# Patient Record
Sex: Female | Born: 1970 | Race: Black or African American | Hispanic: No | Marital: Single | Smoking: Never smoker
Health system: Southern US, Community
[De-identification: ages and names within clinical notes are randomized; demographics above are authoritative.]

---

## 1997-03-15 ENCOUNTER — Observation Stay (HOSPITAL_COMMUNITY): Admission: AD | Admit: 1997-03-15 | Discharge: 1997-03-15 | Payer: Self-pay | Admitting: *Deleted

## 1997-03-21 ENCOUNTER — Inpatient Hospital Stay (HOSPITAL_COMMUNITY): Admission: AD | Admit: 1997-03-21 | Discharge: 1997-03-22 | Payer: Self-pay | Admitting: Obstetrics & Gynecology

## 1997-03-23 ENCOUNTER — Inpatient Hospital Stay (HOSPITAL_COMMUNITY): Admission: AD | Admit: 1997-03-23 | Discharge: 1997-03-25 | Payer: Self-pay | Admitting: Obstetrics

## 2008-09-08 ENCOUNTER — Emergency Department: Payer: Self-pay | Admitting: Emergency Medicine

## 2009-02-24 ENCOUNTER — Emergency Department: Payer: Self-pay | Admitting: Internal Medicine

## 2019-01-11 ENCOUNTER — Ambulatory Visit (LOCAL_COMMUNITY_HEALTH_CENTER): Payer: Self-pay

## 2019-01-11 ENCOUNTER — Other Ambulatory Visit: Payer: Self-pay

## 2019-01-11 DIAGNOSIS — Z23 Encounter for immunization: Secondary | ICD-10-CM

## 2019-01-11 NOTE — Progress Notes (Signed)
As client has FPW, influenza vaccine from state supply administered. Rich Number, RN

## 2019-06-12 DIAGNOSIS — R946 Abnormal results of thyroid function studies: Secondary | ICD-10-CM | POA: Diagnosis not present

## 2019-06-12 DIAGNOSIS — N921 Excessive and frequent menstruation with irregular cycle: Secondary | ICD-10-CM | POA: Diagnosis not present

## 2019-10-04 ENCOUNTER — Ambulatory Visit: Payer: Self-pay | Admitting: Dermatology

## 2019-10-23 DIAGNOSIS — Z13220 Encounter for screening for lipoid disorders: Secondary | ICD-10-CM | POA: Diagnosis not present

## 2019-10-23 DIAGNOSIS — Z124 Encounter for screening for malignant neoplasm of cervix: Secondary | ICD-10-CM | POA: Diagnosis not present

## 2019-10-23 DIAGNOSIS — Z131 Encounter for screening for diabetes mellitus: Secondary | ICD-10-CM | POA: Diagnosis not present

## 2019-10-23 DIAGNOSIS — Z01419 Encounter for gynecological examination (general) (routine) without abnormal findings: Secondary | ICD-10-CM | POA: Diagnosis not present

## 2019-10-23 DIAGNOSIS — Z136 Encounter for screening for cardiovascular disorders: Secondary | ICD-10-CM | POA: Diagnosis not present

## 2019-10-23 DIAGNOSIS — Z1321 Encounter for screening for nutritional disorder: Secondary | ICD-10-CM | POA: Diagnosis not present

## 2019-10-31 LAB — HM PAP SMEAR

## 2019-11-08 ENCOUNTER — Other Ambulatory Visit: Payer: Self-pay | Admitting: Obstetrics and Gynecology

## 2019-11-08 DIAGNOSIS — Z1231 Encounter for screening mammogram for malignant neoplasm of breast: Secondary | ICD-10-CM

## 2019-11-15 ENCOUNTER — Ambulatory Visit
Admission: RE | Admit: 2019-11-15 | Discharge: 2019-11-15 | Disposition: A | Discharge status: Home / Self Care | Payer: BC Managed Care – PPO | Source: Ambulatory Visit | Attending: Obstetrics and Gynecology | Admitting: Obstetrics and Gynecology

## 2019-11-15 DIAGNOSIS — Z1231 Encounter for screening mammogram for malignant neoplasm of breast: Secondary | ICD-10-CM | POA: Diagnosis not present

## 2019-11-21 ENCOUNTER — Other Ambulatory Visit: Payer: Self-pay | Admitting: Obstetrics and Gynecology

## 2019-11-21 DIAGNOSIS — R928 Other abnormal and inconclusive findings on diagnostic imaging of breast: Secondary | ICD-10-CM

## 2019-11-21 DIAGNOSIS — N6489 Other specified disorders of breast: Secondary | ICD-10-CM

## 2019-11-21 DIAGNOSIS — N6320 Unspecified lump in the left breast, unspecified quadrant: Secondary | ICD-10-CM

## 2019-11-21 DIAGNOSIS — R921 Mammographic calcification found on diagnostic imaging of breast: Secondary | ICD-10-CM

## 2019-12-11 ENCOUNTER — Other Ambulatory Visit: Payer: Self-pay

## 2019-12-11 ENCOUNTER — Ambulatory Visit
Admission: RE | Admit: 2019-12-11 | Discharge: 2019-12-11 | Disposition: A | Discharge status: Home / Self Care | Payer: BC Managed Care – PPO | Source: Ambulatory Visit | Attending: Obstetrics and Gynecology | Admitting: Obstetrics and Gynecology

## 2019-12-11 DIAGNOSIS — N6320 Unspecified lump in the left breast, unspecified quadrant: Secondary | ICD-10-CM

## 2019-12-11 DIAGNOSIS — N6489 Other specified disorders of breast: Secondary | ICD-10-CM | POA: Insufficient documentation

## 2019-12-11 DIAGNOSIS — R928 Other abnormal and inconclusive findings on diagnostic imaging of breast: Secondary | ICD-10-CM

## 2019-12-11 DIAGNOSIS — R921 Mammographic calcification found on diagnostic imaging of breast: Secondary | ICD-10-CM

## 2019-12-17 ENCOUNTER — Other Ambulatory Visit: Payer: Self-pay | Admitting: Obstetrics and Gynecology

## 2019-12-17 DIAGNOSIS — R928 Other abnormal and inconclusive findings on diagnostic imaging of breast: Secondary | ICD-10-CM

## 2019-12-17 DIAGNOSIS — N6320 Unspecified lump in the left breast, unspecified quadrant: Secondary | ICD-10-CM

## 2019-12-19 ENCOUNTER — Ambulatory Visit
Admission: RE | Admit: 2019-12-19 | Discharge: 2019-12-19 | Disposition: A | Discharge status: Home / Self Care | Payer: BC Managed Care – PPO | Source: Ambulatory Visit | Attending: Obstetrics and Gynecology | Admitting: Obstetrics and Gynecology

## 2019-12-19 ENCOUNTER — Other Ambulatory Visit: Payer: Self-pay

## 2019-12-19 DIAGNOSIS — N6320 Unspecified lump in the left breast, unspecified quadrant: Secondary | ICD-10-CM

## 2019-12-19 DIAGNOSIS — R928 Other abnormal and inconclusive findings on diagnostic imaging of breast: Secondary | ICD-10-CM | POA: Diagnosis not present

## 2019-12-19 HISTORY — PX: BREAST BIOPSY: SHX20

## 2019-12-20 LAB — SURGICAL PATHOLOGY

## 2020-01-15 ENCOUNTER — Ambulatory Visit: Payer: Self-pay | Admitting: Dermatology

## 2020-03-26 DIAGNOSIS — Z20822 Contact with and (suspected) exposure to covid-19: Secondary | ICD-10-CM | POA: Diagnosis not present

## 2020-03-26 DIAGNOSIS — Z03818 Encounter for observation for suspected exposure to other biological agents ruled out: Secondary | ICD-10-CM | POA: Diagnosis not present

## 2020-09-09 ENCOUNTER — Other Ambulatory Visit: Payer: Self-pay | Admitting: Obstetrics and Gynecology

## 2020-09-09 DIAGNOSIS — R928 Other abnormal and inconclusive findings on diagnostic imaging of breast: Secondary | ICD-10-CM

## 2020-09-09 DIAGNOSIS — Z1231 Encounter for screening mammogram for malignant neoplasm of breast: Secondary | ICD-10-CM

## 2020-09-22 ENCOUNTER — Other Ambulatory Visit: Payer: Self-pay

## 2020-09-22 ENCOUNTER — Other Ambulatory Visit: Payer: Self-pay | Admitting: Obstetrics and Gynecology

## 2020-09-22 ENCOUNTER — Ambulatory Visit
Admission: RE | Admit: 2020-09-22 | Discharge: 2020-09-22 | Disposition: A | Discharge status: Home / Self Care | Payer: BC Managed Care – PPO | Source: Ambulatory Visit | Attending: Obstetrics and Gynecology | Admitting: Obstetrics and Gynecology

## 2020-09-22 DIAGNOSIS — Z1231 Encounter for screening mammogram for malignant neoplasm of breast: Secondary | ICD-10-CM | POA: Insufficient documentation

## 2020-09-22 DIAGNOSIS — R928 Other abnormal and inconclusive findings on diagnostic imaging of breast: Secondary | ICD-10-CM

## 2020-10-23 DIAGNOSIS — Z01419 Encounter for gynecological examination (general) (routine) without abnormal findings: Secondary | ICD-10-CM | POA: Diagnosis not present

## 2020-10-23 DIAGNOSIS — Z8349 Family history of other endocrine, nutritional and metabolic diseases: Secondary | ICD-10-CM | POA: Diagnosis not present

## 2020-10-23 DIAGNOSIS — E559 Vitamin D deficiency, unspecified: Secondary | ICD-10-CM | POA: Diagnosis not present

## 2020-10-23 DIAGNOSIS — N951 Menopausal and female climacteric states: Secondary | ICD-10-CM | POA: Diagnosis not present

## 2020-10-23 DIAGNOSIS — Z131 Encounter for screening for diabetes mellitus: Secondary | ICD-10-CM | POA: Diagnosis not present

## 2020-11-04 ENCOUNTER — Emergency Department: Payer: BC Managed Care – PPO

## 2020-11-04 ENCOUNTER — Emergency Department
Admission: EM | Admit: 2020-11-04 | Discharge: 2020-11-04 | Disposition: A | Discharge status: Home / Self Care | Payer: BC Managed Care – PPO | Attending: Emergency Medicine | Admitting: Emergency Medicine

## 2020-11-04 ENCOUNTER — Other Ambulatory Visit: Payer: Self-pay

## 2020-11-04 ENCOUNTER — Encounter: Payer: Self-pay | Admitting: Emergency Medicine

## 2020-11-04 DIAGNOSIS — R52 Pain, unspecified: Secondary | ICD-10-CM

## 2020-11-04 DIAGNOSIS — N200 Calculus of kidney: Secondary | ICD-10-CM | POA: Insufficient documentation

## 2020-11-04 DIAGNOSIS — N644 Mastodynia: Secondary | ICD-10-CM | POA: Insufficient documentation

## 2020-11-04 DIAGNOSIS — R109 Unspecified abdominal pain: Secondary | ICD-10-CM | POA: Diagnosis not present

## 2020-11-04 DIAGNOSIS — R197 Diarrhea, unspecified: Secondary | ICD-10-CM | POA: Diagnosis not present

## 2020-11-04 LAB — URINALYSIS, COMPLETE (UACMP) WITH MICROSCOPIC
Bilirubin Urine: NEGATIVE
Glucose, UA: NEGATIVE mg/dL
Ketones, ur: 5 mg/dL — AB
Nitrite: POSITIVE — AB
Protein, ur: NEGATIVE mg/dL
Specific Gravity, Urine: 1.016 (ref 1.005–1.030)
pH: 6 (ref 5.0–8.0)

## 2020-11-04 LAB — TROPONIN I (HIGH SENSITIVITY): Troponin I (High Sensitivity): 3 ng/L (ref <18)

## 2020-11-04 LAB — COMPREHENSIVE METABOLIC PANEL
ALT: 23 U/L (ref 0–44)
AST: 19 U/L (ref 15–41)
Albumin: 4 g/dL (ref 3.5–5.0)
Alkaline Phosphatase: 70 U/L (ref 38–126)
Anion gap: 8 (ref 5–15)
BUN: 12 mg/dL (ref 6–20)
CO2: 28 mmol/L (ref 22–32)
Calcium: 9 mg/dL (ref 8.9–10.3)
Chloride: 95 mmol/L — ABNORMAL LOW (ref 98–111)
Creatinine, Ser: 0.81 mg/dL (ref 0.44–1.00)
GFR, Estimated: >60 mL/min (ref >60)
Glucose, Bld: 96 mg/dL (ref 70–99)
Potassium: 3.6 mmol/L (ref 3.5–5.1)
Sodium: 131 mmol/L — ABNORMAL LOW (ref 135–145)
Total Bilirubin: 1 mg/dL (ref 0.3–1.2)
Total Protein: 9 g/dL — ABNORMAL HIGH (ref 6.5–8.1)

## 2020-11-04 LAB — CBC
HCT: 32.2 % — ABNORMAL LOW (ref 36.0–46.0)
Hemoglobin: 10.7 g/dL — ABNORMAL LOW (ref 12.0–15.0)
MCH: 24.6 pg — ABNORMAL LOW (ref 26.0–34.0)
MCHC: 33.2 g/dL (ref 30.0–36.0)
MCV: 74 fL — ABNORMAL LOW (ref 80.0–100.0)
Platelets: 236 10*3/uL (ref 150–400)
RBC: 4.35 MIL/uL (ref 3.87–5.11)
RDW: 15.7 % — ABNORMAL HIGH (ref 11.5–15.5)
WBC: 11.9 10*3/uL — ABNORMAL HIGH (ref 4.0–10.5)
nRBC: 0 % (ref 0.0–0.2)

## 2020-11-04 LAB — LIPASE, BLOOD: Lipase: 31 U/L (ref 11–51)

## 2020-11-04 LAB — POC URINE PREG, ED: Preg Test, Ur: NEGATIVE

## 2020-11-04 MED ORDER — CEPHALEXIN 500 MG PO CAPS: 500.0000 mg | ORAL_CAPSULE | Freq: Four times a day (QID) | ORAL | 0 refills | Status: AC

## 2020-11-04 MED ORDER — FAMOTIDINE 20 MG PO TABS
20.0000 mg | ORAL_TABLET | Freq: Once | ORAL | Status: AC
  Administered 2020-11-04: 20 mg via ORAL
  Filled 2020-11-04: qty 1

## 2020-11-04 MED ORDER — ALUM & MAG HYDROXIDE-SIMETH 200-200-20 MG/5ML PO SUSP
30.0000 mL | Freq: Once | ORAL | Status: AC
  Administered 2020-11-04: 30 mL via ORAL
  Filled 2020-11-04: qty 30

## 2020-11-04 MED ORDER — FAMOTIDINE 20 MG PO TABS: 20.0000 mg | ORAL_TABLET | Freq: Two times a day (BID) | ORAL | 1 refills | Status: DC

## 2020-11-04 MED ORDER — LIDOCAINE VISCOUS HCL 2 % MT SOLN
15.0000 mL | Freq: Once | OROMUCOSAL | Status: AC
  Administered 2020-11-04: 15 mL via ORAL
  Filled 2020-11-04: qty 15

## 2020-11-04 MED ORDER — CEPHALEXIN 500 MG PO CAPS
500.0000 mg | ORAL_CAPSULE | Freq: Once | ORAL | Status: AC
  Administered 2020-11-04: 500 mg via ORAL
  Filled 2020-11-04: qty 1

## 2020-11-04 NOTE — Discharge Instructions (Addendum)
Your blood work, EKG and ultrasound were all reassuring today.  It does look you have a small kidney stone on the left side.  Have a urinary tract infection.  Please take the antibiotic for the next 7 days.  You do not have any issues with your gallbladder.  Your abdominal pain may be related to the meloxicam.  Please stop taking this.  I we will also start you on Pepcid which is an acid blocker in case her pain is related to acid reflux.

## 2020-11-04 NOTE — ED Notes (Signed)
POC Urine Preg Neg

## 2020-11-04 NOTE — ED Provider Notes (Addendum)
[facility]  ____________________________________________   Event Date/Time   First MD Initiated Contact with Patient 11/04/20 1431     (approximate)  I have reviewed the triage vital signs and the nursing notes.   HISTORY  Chief Complaint Abdominal Pain    HPI Ruth Hanson is a 50 y.o. female past medical history of hypertension who presents with abdominal pain.  Symptoms been going on for the past week.  She endorses a sharp stabbing pain radiating from her right flank under the right breast.  It has been intermittent.  She endorses diarrhea but denies nausea vomiting.  No fevers or chills.  She denies chest pain or dyspnea.  Patient thinks that her symptoms are related to starting new medication.  About a week ago her physician started her on HCTZ, vitamin D and meloxicam.  She is not sure what she is taking the meloxicam for.  She is still able to tolerate p.o.         History reviewed. No pertinent past medical history.  There are no problems to display for this patient.   Past Surgical History:  Procedure Laterality Date   BREAST BIOPSY Left 12/19/2019   6:00 4cmfn x clip path pending    Prior to Admission medications   Medication Sig Start Date End Date Taking? Authorizing Provider  cephALEXin (KEFLEX) 500 MG capsule Take 1 capsule (500 mg total) by mouth 4 (four) times daily for 7 days. 11/04/20 11/11/20 Yes Georga Hacking, MD  famotidine (PEPCID) 20 MG tablet Take 1 tablet (20 mg total) by mouth 2 (two) times daily. 11/04/20 12/04/20 Yes Georga Hacking, MD    Allergies Patient has no known allergies.  No family history on file.  Social History    Review of Systems   Review of Systems  Constitutional:  Negative for activity change, chills and fever.  Respiratory:  Negative for chest tightness and shortness of breath.   Cardiovascular:  Negative for chest pain.  Gastrointestinal:  Positive for abdominal pain and diarrhea.  Negative for nausea and vomiting.  Genitourinary:  Negative for dysuria and hematuria.  All other systems reviewed and are negative.  Physical Exam Updated Vital Signs BP 135/86 (BP Location: Left Arm)   Pulse 95   Temp 99.1 F (37.3 C) (Oral)   Resp 17   Ht 5\' 6"  (1.676 m)   Wt (!) 138.3 kg   SpO2 99%   BMI 49.23 kg/m   Physical Exam Vitals and nursing note reviewed.  Constitutional:      General: She is not in acute distress.    Appearance: Normal appearance.  HENT:     Head: Normocephalic and atraumatic.  Eyes:     General: No scleral icterus.    Conjunctiva/sclera: Conjunctivae normal.  Pulmonary:     Effort: Pulmonary effort is normal. No respiratory distress.     Breath sounds: No stridor.  Abdominal:     General: Abdomen is flat.     Comments: Tenderness to palpation of the upper quadrant without guarding  Musculoskeletal:        General: No deformity or signs of injury.     Cervical back: Normal range of motion.  Skin:    General: Skin is dry.     Coloration: Skin is not jaundiced or pale.  Neurological:     General: No focal deficit present.     Mental Status: She is alert and oriented to person, place, and time. Mental status is  at baseline.  Psychiatric:        Mood and Affect: Mood normal.        Behavior: Behavior normal.     LABS (all labs ordered are listed, but only abnormal results are displayed)  Labs Reviewed  COMPREHENSIVE METABOLIC PANEL - Abnormal; Notable for the following components:      Result Value   Sodium 131 (*)    Chloride 95 (*)    Total Protein 9.0 (*)    All other components within normal limits  CBC - Abnormal; Notable for the following components:   WBC 11.9 (*)    Hemoglobin 10.7 (*)    HCT 32.2 (*)    MCV 74.0 (*)    MCH 24.6 (*)    RDW 15.7 (*)    All other components within normal limits  URINALYSIS, COMPLETE (UACMP) WITH MICROSCOPIC - Abnormal; Notable for the following components:   Color, Urine YELLOW (*)     APPearance HAZY (*)    Hgb urine dipstick MODERATE (*)    Ketones, ur 5 (*)    Nitrite POSITIVE (*)    Leukocytes,Ua TRACE (*)    Bacteria, UA RARE (*)    All other components within normal limits  LIPASE, BLOOD  POC URINE PREG, ED  TROPONIN I (HIGH SENSITIVITY)   ____________________________________________  EKG  Normal sinus rhythm, normal intervals, normal axis, nonspecific T wave inversions in leads III, AVF, V4 ____________________________________________  RADIOLOGY I, Randol Kern, personally viewed and evaluated these images (plain radiographs) as part of my medical decision making, as well as reviewing the written report by the radiologist.  ED MD interpretation: I reviewed the chest x-ray which did not show any acute cardiopulmonary process  Read the right upper quadrant ultrasound which is normal, no gallstones    ____________________________________________   PROCEDURES  Procedure(s) performed (including Critical Care):  Procedures   ____________________________________________   INITIAL IMPRESSION / ASSESSMENT AND PLAN / ED COURSE     50 year old female presents with 1 week of intermittent right upper quadrant pain.  Symptoms started after she was started on several new medications including meloxicam.  On exam she is very well-appearing.  Does have some tenderness in the right upper quadrant but abdomen is benign.  From triage a right upper quadrant ultrasound was obtained which shows no gallstones, no cholecystitis.  Labs are also reassuring.  LFTs, lipase normal.  I obtained an EKG given concern for anginal type symptoms potentially, there are some nonspecific T wave inversions so will obtain troponin.  Troponin is negative.  UA is positive for blood, nitrates and some leuks.  Obtained a CT renal study to rule out stone which interestingly shows a 3 mm nonobstructing stone in the left which is not on the side of her pain.  However in the ED she did  have some intermittent left-sided chest pain as well.  UA has nitrites but not a significant mount of WBCs.  Stone is not obstructing she has no fevers mild leukocytosis and is well-appearing.  Will start her on Keflex for potential UTI.  I also advised that she avoid the meloxicam if this is potentially contributing to some gastritis or abdominal upset.  Will prescribe Pepcid.  Clinical Course as of 11/04/20 1722  Tue Nov 04, 2020  1606 Troponin I (High Sensitivity): 3 [KM]    Clinical Course User Index [KM] Georga Hacking, MD     ____________________________________________   FINAL CLINICAL IMPRESSION(S) / ED DIAGNOSES  Final  diagnoses:  Pain  Abdominal pain, unspecified abdominal location  Kidney stone     ED Discharge Orders          Ordered    famotidine (PEPCID) 20 MG tablet  2 times daily        11/04/20 1608    cephALEXin (KEFLEX) 500 MG capsule  4 times daily        11/04/20 1712             Note:  This document was prepared using Dragon voice recognition software and may include unintentional dictation errors.    Georga Hacking, MD 11/04/20 1608    Georga Hacking, MD 11/04/20 [phone]

## 2020-11-04 NOTE — ED Notes (Signed)
Pt states sharp pain under right breast area, began about a week ago when she started taking 6 new meds, meloxicam, [name], Folitab, B12, D3, HCTZ

## 2020-11-04 NOTE — ED Provider Notes (Signed)
Emergency Medicine Provider Triage Evaluation Note  Ruth Hanson, a 50 y.o. female  was evaluated in triage.  Pt complains of RUQ abdominal pain for the last week.  Patient noted onset with the start of several new medicines including HCTZ, vitamin D, meloxicam and OCP.  Patient notes that her symptoms seem to increase after she eats her lunchtime meal.  She has associated nausea and vomiting and the pain is worse when she has bouts of nausea but she denies any constipation, diarrhea, FCS.  Review of Systems  Positive: RUQ abdominal pain Negative: FCS  Physical Exam  BP (!) 141/86 (BP Location: Left Arm)   Pulse 96   Temp 99.1 F (37.3 C) (Oral)   Resp 18   Ht 5\' 6"  (1.676 m)   Wt (!) 138.3 kg   SpO2 100%   BMI 49.23 kg/m  Gen:   Awake, no distress NAD Resp:  Normal effort CTA MSK:   Moves extremities without difficulty  Other:  ABD: Soft and mildly tender to the right upper quadrant.  Normal bowel sounds appreciated.  No CVA tenderness elicited.  Medical Decision Making  Medically screening exam initiated at 1:05 PM.  Appropriate orders placed.  was informed that the remainder of the evaluation will be completed by another provider, this initial triage assessment does not replace that evaluation, and the importance of remaining in the ED until their evaluation is complete.  Patient ED evaluation of right upper quadrant abdominal pain.   Hassel Neth, PA-C 11/04/20 1307    11/06/20, MD 11/04/20 [phone]

## 2020-11-04 NOTE — ED Triage Notes (Signed)
Pt reports that she has been having Upper right sided under her breast, since she has started on her medication, B-12,[name], HCTZ,Vitamin  D3, Meloxicam, andFolitab. Hurts after lunch time after she eats. Has had N/V pain is worse with  vomiting.

## 2020-12-31 ENCOUNTER — Other Ambulatory Visit: Payer: Self-pay

## 2020-12-31 ENCOUNTER — Ambulatory Visit
Admission: RE | Admit: 2020-12-31 | Discharge: 2020-12-31 | Disposition: A | Discharge status: Home / Self Care | Payer: BC Managed Care – PPO | Attending: Nurse Practitioner | Admitting: Nurse Practitioner

## 2020-12-31 ENCOUNTER — Encounter: Payer: Self-pay | Admitting: Nurse Practitioner

## 2020-12-31 ENCOUNTER — Ambulatory Visit
Admission: RE | Admit: 2020-12-31 | Discharge: 2020-12-31 | Disposition: A | Discharge status: Home / Self Care | Payer: BC Managed Care – PPO | Source: Ambulatory Visit | Attending: Nurse Practitioner | Admitting: Nurse Practitioner

## 2020-12-31 ENCOUNTER — Ambulatory Visit (INDEPENDENT_AMBULATORY_CARE_PROVIDER_SITE_OTHER): Payer: BC Managed Care – PPO | Admitting: Nurse Practitioner

## 2020-12-31 VITALS — BP 127/76 | HR 86 | Temp 98.5°F | Ht 68.0 in | Wt 293.8 lb

## 2020-12-31 DIAGNOSIS — E559 Vitamin D deficiency, unspecified: Secondary | ICD-10-CM | POA: Diagnosis not present

## 2020-12-31 DIAGNOSIS — M159 Polyosteoarthritis, unspecified: Secondary | ICD-10-CM | POA: Diagnosis not present

## 2020-12-31 DIAGNOSIS — Z114 Encounter for screening for human immunodeficiency virus [HIV]: Secondary | ICD-10-CM | POA: Diagnosis not present

## 2020-12-31 DIAGNOSIS — K219 Gastro-esophageal reflux disease without esophagitis: Secondary | ICD-10-CM

## 2020-12-31 DIAGNOSIS — Z87442 Personal history of urinary calculi: Secondary | ICD-10-CM | POA: Insufficient documentation

## 2020-12-31 DIAGNOSIS — E669 Obesity, unspecified: Secondary | ICD-10-CM | POA: Insufficient documentation

## 2020-12-31 DIAGNOSIS — Z6841 Body Mass Index (BMI) 40.0 and over, adult: Secondary | ICD-10-CM

## 2020-12-31 DIAGNOSIS — N951 Menopausal and female climacteric states: Secondary | ICD-10-CM

## 2020-12-31 DIAGNOSIS — I10 Essential (primary) hypertension: Secondary | ICD-10-CM

## 2020-12-31 DIAGNOSIS — Z1211 Encounter for screening for malignant neoplasm of colon: Secondary | ICD-10-CM

## 2020-12-31 DIAGNOSIS — E538 Deficiency of other specified B group vitamins: Secondary | ICD-10-CM | POA: Diagnosis not present

## 2020-12-31 DIAGNOSIS — Z7689 Persons encountering health services in other specified circumstances: Secondary | ICD-10-CM

## 2020-12-31 DIAGNOSIS — Z1159 Encounter for screening for other viral diseases: Secondary | ICD-10-CM

## 2020-12-31 MED ORDER — GABAPENTIN 600 MG PO TABS: 300.0000 mg | ORAL_TABLET | Freq: Every day | ORAL | 4 refills | Status: DC

## 2020-12-31 MED ORDER — OMEPRAZOLE 20 MG PO CPDR: 20.0000 mg | DELAYED_RELEASE_CAPSULE | Freq: Every day | ORAL | 4 refills | Status: DC

## 2020-12-31 NOTE — Assessment & Plan Note (Signed)
To left knee, hip, and lower back.  Suspect more OA.  Will obtain baseline imaging of lower back, right hip, and right knee.  Right knee with significant crepitus on exam.  Recommend focus on modest weight loss.  May take Tylenol as needed, up to 3000 MG daily + recommend use of Voltaren gel as needed.  Wear knee support when on feet long hours.  Labs today Vit D, ANA, CRP, ESR to further assess.  Consider PT referral or right knee steroid injection if ongoing or worsening.  Return in 4 weeks.

## 2020-12-31 NOTE — Assessment & Plan Note (Addendum)
Chronic, ongoing with BP at goal in office today and on home readings.  Recommend she monitor BP at least a few mornings a week at home and document.  DASH diet at home.  Continue current medication regimen and adjust as needed.  Labs today: CBC, CMP, TSH.  Return in 4 weeks.  Refills as needed.  Urine ALB future visit.

## 2020-12-31 NOTE — Assessment & Plan Note (Signed)
Chronic, ongoing -- is on birth control by GYN.  Would benefit discontinuation of this in upcoming years.  Discussed treatment options with patient.  Due to her underlying chronic arthritis pain and hot flashes, will trial Gabapentin 300 MG QHS and adjust as needed.  Educated her on this medication and use + side effects.  Return to office in 4 weeks. Up to date on pap with GYN, will attempt to attain records.

## 2020-12-31 NOTE — Progress Notes (Addendum)
New Patient Office Visit  Subjective:  Patient ID: Ruth Hanson, female    DOB: December 13, 1970  Age: 50 y.o. MRN: [id number]  CC:  Chief Complaint  Patient presents with   Gastroesophageal Reflux    Patient states after she eats she notices it, but she also states when she smells different smells after eating she notices that she will become nauseated.    Arthritis    Patient states she is starting to notice that it is moving down to her right knee and right ankle. Patient states it she is noticing it in her right hand. Patient states she has more issues with the right side. Patient states it feels like a cramp and she tries to pop it to release it and it doesn't help it.    Establish Care    Patient is here to establish care.   Menopause    Patient states she is here to discuss Menopause as she is dealing with hot-flashes, mood swings, and irritability. Patient states she will have the sweats and she notices them while she is working out.    Medication Refill    Patient is requesting refill on her medications.    Immunizations    HPI Ruth Hanson presents for new patient visit to establish care.  Introduced to Designer, jewellery role and practice setting.  All questions answered.  Discussed provider/patient relationship and expectations.  Followed by Dr. Cherylann Banas at [facility].  Has never had a primary care provider.     Has had recent pap and labs with Dr. Cherylann Banas not available in [place].  Has history of kidney stones one month ago -- this was first episode on 11/04/20 = 3 mm non obstructing left.  She passed this on own.  No family history of this.  HYPERTENSION Currently taking HCTZ 25 MG daily, no current statin use.  Has been on BP medication for 2 months -- mother has high BP.   Hypertension status: stable  Satisfied with current treatment? yes Duration of hypertension: chronic BP monitoring frequency:  daily BP range: <130/80 consistently BP medication side effects:   no Medication compliance: good compliance Previous BP meds: none Aspirin: no Recurrent headaches: no  -- had these with [facility] Visual changes: no Palpitations: no Dyspnea: no Chest pain: no Lower extremity edema: yes Dizzy/lightheaded: no   GERD Currently taking Pepcid daily, helps a little.  She notes that an hour after eating she will throw up.   GERD control status: uncontrolled Satisfied with current treatment? yes Heartburn frequency: a few times a day Medication side effects: no  Medication compliance: stable Previous GERD medications: Pepcid Antacid use frequency:  none Nature: heavy in throat Location: throat Heartburn duration: 20 minutes Alleviatiating factors: bottle water Aggravating factors: eating Dysphagia: no Odynophagia:  no Hematemesis: no Blood in stool: no EGD: no   ARTHRITIS Ongoing issue for years.  Noted chronic back pain on dx list.  No past imaging available in Epic.  She reports mainly to right side -- right knee pops a lot and then groin area will have catch.  Notices pain to right back.  Pain is sharp and aching, she has to keep rubbing it to ease it.  Worst area is right knee to ankle.   Pain control status: stable Duration: chronic Locations: right lower, right hip/groin, and right knee Quality: sharp and aching Current Pain Level: 6/10 Previous Pain Level: 7/10 Treatments: nothing What Activities task can be accomplished with current medication? Has  difficulty with squatting Previous testing: none Previous pain specialty evaluation: no Non-narcotic analgesic meds: no  MENOPAUSAL SYMPTOMS Last saw Dr. Cherylann Banas two months ago, when having kidney stone issues. Has become worse with menopause the last two years.  Notices a lot at night time with night sweats.  Dr. Cherylann Banas has her on birth control to help regulate cycles and decrease flow -- 3 months.  Her mother stopped cycles at age 28. Gravida/Para: 2/2 Duration: uncontrolled Symptom severity:  moderate Hot flashes: yes Night sweats: yes Sleep disturbances:  occasional Vaginal dryness: no Dyspareunia:no Decreased libido: yes Emotional lability: yes Stress incontinence: no Previous HRT/pharmacotherapy:  current birth control Hysterectomy: no Average interval between menses: 4 weeks Length of menses: 5 days, less heavy with birth control on board Flow: as above Dysmenorrhea: none GYN surgery: none Absolute Contraindications to Hormonal Therapy:     Undiagnosed vaginal bleeding: no    Breast cancer: no -- she has had biopsies    Endometrial cancer: no    Coronary disease: no    Cerebrovascular disease: yes -- her sister recently had a brain aneurysm     Venous thromboembolic disease: no   History reviewed. No pertinent past medical history.  Past Surgical History:  Procedure Laterality Date   BREAST BIOPSY Left 12/19/2019   6:00 4cmfn x clip path pending    Family History  Problem Relation Age of Onset   Cancer Mother 30       stomach   Hypertension Mother    Aneurysm Sister    GER disease Brother     Social History   Socioeconomic History   Marital status: Single    Spouse name: Not on file   Number of children: Not on file   Years of education: Not on file   Highest education level: Not on file  Occupational History   Not on file  Tobacco Use   Smoking status: Never   Smokeless tobacco: Never  Vaping Use   Vaping Use: Never used  Substance and Sexual Activity   Alcohol use: Never   Drug use: Never   Sexual activity: Not on file  Other Topics Concern   Not on file  Social History Narrative   Not on file   Social Determinants of Health   Financial Resource Strain: Low Risk    Difficulty of Paying Living Expenses: Not hard at all  Food Insecurity: No Food Insecurity   Worried About Charity fundraiser in the Last Year: Never true   [place] in the Last Year: Never true  Transportation Needs: No Transportation Needs   Lack of  Transportation (Medical): No   Lack of Transportation (Non-Medical): No  Physical Activity: Inactive   Days of Exercise per Week: 0 days   Minutes of Exercise per Session: 0 min  Stress: No Stress Concern Present   Feeling of Stress : Only a little  Social Connections: Socially Isolated   Frequency of Communication with Friends and Family: Twice a week   Frequency of Social Gatherings with Friends and Family: Twice a week   Attends Religious Services: Never   Marine scientist or Organizations: No   Attends Music therapist: Never   Marital Status: Separated  Intimate Partner Violence: Not At Risk   Fear of Current or Ex-Partner: No   Emotionally Abused: No   Physically Abused: No   Sexually Abused: No    ROS Review of Systems  Constitutional:  Negative for activity  change, appetite change, diaphoresis, fatigue and fever.  Respiratory:  Negative for cough, chest tightness and shortness of breath.   Cardiovascular:  Positive for leg swelling. Negative for chest pain and palpitations.  Gastrointestinal:  Positive for nausea (occasional with reflux). Negative for abdominal distention, abdominal pain, constipation, diarrhea and vomiting.  Endocrine: Negative.   Musculoskeletal:  Positive for arthralgias.  Neurological: Negative.   Psychiatric/Behavioral: Negative.     Objective:   Today's Vitals: BP 127/76   Pulse 86   Temp 98.5 F (36.9 C) (Oral)   Ht 5' 8" (1.727 m)   Wt 293 lb 12.8 oz (133.3 kg)   SpO2 98%   BMI 44.67 kg/m   Physical Exam Vitals and nursing note reviewed.  Constitutional:      General: She is awake. She is not in acute distress.    Appearance: She is well-developed and well-groomed. She is obese. She is not ill-appearing or toxic-appearing.  HENT:     Head: Normocephalic.     Right Ear: Hearing normal.     Left Ear: Hearing normal.  Eyes:     General: Lids are normal.        Right eye: No discharge.        Left eye: No  discharge.     Conjunctiva/sclera: Conjunctivae normal.     Pupils: Pupils are equal, round, and reactive to light.  Neck:     Thyroid: No thyromegaly.     Vascular: No carotid bruit.  Cardiovascular:     Rate and Rhythm: Normal rate and regular rhythm.     Heart sounds: Normal heart sounds. No murmur heard.   No gallop.  Pulmonary:     Effort: Pulmonary effort is normal. No accessory muscle usage or respiratory distress.     Breath sounds: Normal breath sounds.  Abdominal:     General: Bowel sounds are normal. There is no distension.     Palpations: Abdomen is soft.     Tenderness: There is no abdominal tenderness.     Hernia: No hernia is present.  Musculoskeletal:     Cervical back: Normal range of motion and neck supple.     Lumbar back: Normal.     Right hip: Normal.     Left hip: Normal.     Right knee: Crepitus present. No swelling or lacerations. Normal range of motion. No tenderness.     Left knee: No swelling, lacerations or crepitus. Normal range of motion. No tenderness.     Right lower leg: 1+ Edema present.     Left lower leg: 1+ Edema present.  Lymphadenopathy:     Cervical: No cervical adenopathy.  Skin:    General: Skin is warm and dry.  Neurological:     Mental Status: She is alert and oriented to person, place, and time.  Psychiatric:        Attention and Perception: Attention normal.        Mood and Affect: Mood normal.        Behavior: Behavior normal. Behavior is cooperative.        Thought Content: Thought content normal.        Judgment: Judgment normal.    Assessment & Plan:   Problem List Items Addressed This Visit       Cardiovascular and Mediastinum   Essential hypertension    Chronic, ongoing with BP at goal in office today and on home readings.  Recommend she monitor BP at least a few mornings a week  at home and document.  DASH diet at home.  Continue current medication regimen and adjust as needed.  Labs today: CBC, CMP, TSH.  Return in 4  weeks.  Refills as needed.  Urine ALB future visit.       Relevant Medications   hydrochlorothiazide (HYDRODIURIL) 25 MG tablet   Other Relevant Orders   Lipid Panel w/o Chol/HDL Ratio   TSH   HgB A1c     Digestive   Gastroesophageal reflux disease without esophagitis    Chronic, ongoing with minimal benefit from Pepcid.  Will trial short period of Prilosec and recommend she start food diary to work on diet changes and reduce reflux symptoms -- educated her on trigger foods.  Educated her on Prilosec and side effects.  Return in 4 weeks.  If ongoing or worsening consider GI referral.      Relevant Medications   omeprazole (PRILOSEC) 20 MG capsule     Musculoskeletal and Integument   Primary osteoarthritis involving multiple joints    To left knee, hip, and lower back.  Suspect more OA.  Will obtain baseline imaging of lower back, right hip, and right knee.  Right knee with significant crepitus on exam.  Recommend focus on modest weight loss.  May take Tylenol as needed, up to 3000 MG daily + recommend use of Voltaren gel as needed.  Wear knee support when on feet long hours.  Labs today Vit D, ANA, CRP, ESR to further assess.  Consider PT referral or right knee steroid injection if ongoing or worsening.  Return in 4 weeks.      Relevant Orders   ANA w/Reflex if Positive   C-reactive protein   Sed Rate (ESR)   Comprehensive metabolic panel   DG Knee Complete 4 Views Right   DG Hip Unilat W OR W/O Pelvis 2-3 Views Right   DG Lumbar Spine Complete     Other   History of kidney stones    Last episode 11/04/20, monitor closely and refer to urology as needed.      Menopausal symptoms    Chronic, ongoing -- is on birth control by GYN.  Would benefit discontinuation of this in upcoming years.  Discussed treatment options with patient.  Due to her underlying chronic arthritis pain and hot flashes, will trial Gabapentin 300 MG QHS and adjust as needed.  Educated her on this medication and  use + side effects.  Return to office in 4 weeks. Up to date on pap with GYN, will attempt to attain records.      Obesity - Primary    BMI 44.67 with HTN and GERD.  Recommended eating smaller high protein, low fat meals more frequently and exercising 30 mins a day 5 times a week with a goal of 10-15lb weight loss in the next 3 months. Patient voiced their understanding and motivation to adhere to these recommendations.       Other Visit Diagnoses     Vitamin D deficiency       Reports history of low levels, check today and initiate supplement as needed based on findings.   Relevant Orders   VITAMIN D 25 Hydroxy (Vit-D Deficiency, Fractures)   Vitamin B12 deficiency       Reports history of low levels, check today and initiate supplement as needed based on findings.   Relevant Orders   Vitamin B12   CBC with Differential/Platelet   Colon cancer screening       Referral to GI for  colonoscopy.   Relevant Orders   Ambulatory referral to Gastroenterology   Need for hepatitis C screening test       Hep C screening today per guidelines, discussed with patient.   Relevant Orders   Hepatitis C antibody   Encounter for screening for HIV       HIV screening today per guidelines, discussed with patient.   Relevant Orders   HIV Antibody (routine testing w rflx)   Encounter to establish care       New Patient to clinic today.       Outpatient Encounter Medications as of 12/31/2020  Medication Sig   Cholecalciferol (VITAMIN D3) 1.25 MG (50000 UT) CAPS Take 1 capsule by mouth once a week.   [facility] VITAMIN B12 1000 MCG tablet Take 1,000 mcg by mouth daily.   gabapentin (NEURONTIN) 600 MG tablet Take 0.5 tablets (300 mg total) by mouth at bedtime.   hydrochlorothiazide (HYDRODIURIL) 25 MG tablet Take 25 mg by mouth daily.   [name] 3-0.02 MG tablet Take 1 tablet by mouth daily.   omeprazole (PRILOSEC) 20 MG capsule Take 1 capsule (20 mg total) by mouth daily.   [DISCONTINUED] famotidine  (PEPCID) 20 MG tablet Take 1 tablet (20 mg total) by mouth 2 (two) times daily.   No facility-administered encounter medications on file as of 12/31/2020.    Follow-up: Return in about 4 weeks (around 01/28/2021) for GERD, OA, MENOPAUSE.   Venita Lick, NP

## 2020-12-31 NOTE — Assessment & Plan Note (Signed)
Last episode 11/04/20, monitor closely and refer to urology as needed.

## 2020-12-31 NOTE — Assessment & Plan Note (Signed)
Chronic, ongoing with minimal benefit from Pepcid.  Will trial short period of Prilosec and recommend she start food diary to work on diet changes and reduce reflux symptoms -- educated her on trigger foods.  Educated her on Prilosec and side effects.  Return in 4 weeks.  If ongoing or worsening consider GI referral.

## 2020-12-31 NOTE — Assessment & Plan Note (Signed)
BMI 44.67 with HTN and GERD.  Recommended eating smaller high protein, low fat meals more frequently and exercising 30 mins a day 5 times a week with a goal of 10-15lb weight loss in the next 3 months. Patient voiced their understanding and motivation to adhere to these recommendations.

## 2020-12-31 NOTE — Patient Instructions (Signed)

## 2021-01-01 NOTE — Progress Notes (Signed)
Good morning crew, I will have two separate messages for this patient.  This one is in regard to imaging for her.  Please let her know that as suspected she is showing some degenerative disc to lower back, as we discussed.  This is most likely causing some of the discomfort we dicussed to lower back and hip area.  Right hip shows no arthritic changes, good news.  Right knee, as suspected, does show some arthritic changes.  I recommend taking Tylenol as we discussed and using Voltaren gel + the Gabapentin for night sweats may also help the aches and pains.  Any questions? Keep being awesome!!  Thank you for allowing me to participate in your care.  I appreciate you. Kindest regards, Crews Mccollam

## 2021-01-02 ENCOUNTER — Telehealth: Payer: Self-pay | Admitting: Nurse Practitioner

## 2021-01-02 ENCOUNTER — Encounter: Payer: Self-pay | Admitting: Nurse Practitioner

## 2021-01-02 DIAGNOSIS — R768 Other specified abnormal immunological findings in serum: Secondary | ICD-10-CM | POA: Insufficient documentation

## 2021-01-02 DIAGNOSIS — M159 Polyosteoarthritis, unspecified: Secondary | ICD-10-CM

## 2021-01-02 DIAGNOSIS — R7982 Elevated C-reactive protein (CRP): Secondary | ICD-10-CM | POA: Insufficient documentation

## 2021-01-02 LAB — COMPREHENSIVE METABOLIC PANEL
ALT: 18 IU/L (ref 0–32)
AST: 17 IU/L (ref 0–40)
Albumin/Globulin Ratio: 1 — ABNORMAL LOW (ref 1.2–2.2)
Albumin: 4 g/dL (ref 3.8–4.9)
Alkaline Phosphatase: 63 IU/L (ref 44–121)
BUN/Creatinine Ratio: 10 (ref 9–23)
BUN: 9 mg/dL (ref 6–24)
Bilirubin Total: 0.5 mg/dL (ref 0.0–1.2)
CO2: 26 mmol/L (ref 20–29)
Calcium: 9.5 mg/dL (ref 8.7–10.2)
Chloride: 98 mmol/L (ref 96–106)
Creatinine, Ser: 0.94 mg/dL (ref 0.57–1.00)
Globulin, Total: 3.9 g/dL (ref 1.5–4.5)
Glucose: 89 mg/dL (ref 70–99)
Potassium: 4 mmol/L (ref 3.5–5.2)
Sodium: 136 mmol/L (ref 134–144)
Total Protein: 7.9 g/dL (ref 6.0–8.5)
eGFR: 73 mL/min/{1.73_m2} (ref >59)

## 2021-01-02 LAB — HEMOGLOBIN A1C
Est. average glucose Bld gHb Est-mCnc: 114 mg/dL
Hgb A1c MFr Bld: 5.6 % (ref 4.8–5.6)

## 2021-01-02 LAB — CBC WITH DIFFERENTIAL/PLATELET
Basophils Absolute: 0 10*3/uL (ref 0.0–0.2)
Basos: 1 %
EOS (ABSOLUTE): 0 10*3/uL (ref 0.0–0.4)
Eos: 1 %
Hematocrit: 37.4 % (ref 34.0–46.6)
Hemoglobin: 11.8 g/dL (ref 11.1–15.9)
Immature Grans (Abs): 0 10*3/uL (ref 0.0–0.1)
Immature Granulocytes: 0 %
Lymphocytes Absolute: 1.6 10*3/uL (ref 0.7–3.1)
Lymphs: 27 %
MCH: 24 pg — ABNORMAL LOW (ref 26.6–33.0)
MCHC: 31.6 g/dL (ref 31.5–35.7)
MCV: 76 fL — ABNORMAL LOW (ref 79–97)
Monocytes Absolute: 0.5 10*3/uL (ref 0.1–0.9)
Monocytes: 8 %
Neutrophils Absolute: 3.7 10*3/uL (ref 1.4–7.0)
Neutrophils: 63 %
Platelets: 234 10*3/uL (ref 150–450)
RBC: 4.91 x10E6/uL (ref 3.77–5.28)
RDW: 14.2 % (ref 11.7–15.4)
WBC: 5.9 10*3/uL (ref 3.4–10.8)

## 2021-01-02 LAB — SEDIMENTATION RATE: Sed Rate: 70 mm/hr — ABNORMAL HIGH (ref 0–40)

## 2021-01-02 LAB — ANA W/REFLEX IF POSITIVE
Anti JO-1: <0.2 AI (ref 0.0–0.9)
Anti Nuclear Antibody (ANA): POSITIVE — AB
Centromere Ab Screen: <0.2 AI (ref 0.0–0.9)
Chromatin Ab SerPl-aCnc: 0.6 AI (ref 0.0–0.9)
ENA RNP Ab: 7 AI — ABNORMAL HIGH (ref 0.0–0.9)
ENA SM Ab Ser-aCnc: <0.2 AI (ref 0.0–0.9)
ENA SSA (RO) Ab: 5.9 AI — ABNORMAL HIGH (ref 0.0–0.9)
ENA SSB (LA) Ab: 0.2 AI (ref 0.0–0.9)
Scleroderma (Scl-70) (ENA) Antibody, IgG: 0.3 AI (ref 0.0–0.9)
dsDNA Ab: 5 IU/mL (ref 0–9)

## 2021-01-02 LAB — LIPID PANEL W/O CHOL/HDL RATIO
Cholesterol, Total: 195 mg/dL (ref 100–199)
HDL: 85 mg/dL (ref >39)
LDL Chol Calc (NIH): 92 mg/dL (ref 0–99)
Triglycerides: 104 mg/dL (ref 0–149)
VLDL Cholesterol Cal: 18 mg/dL (ref 5–40)

## 2021-01-02 LAB — TSH: TSH: 1.19 u[IU]/mL (ref 0.450–4.500)

## 2021-01-02 LAB — VITAMIN D 25 HYDROXY (VIT D DEFICIENCY, FRACTURES): Vit D, 25-Hydroxy: 97.3 ng/mL (ref 30.0–100.0)

## 2021-01-02 LAB — C-REACTIVE PROTEIN: CRP: 5 mg/L (ref 0–10)

## 2021-01-02 LAB — HEPATITIS C ANTIBODY: Hep C Virus Ab: 0.2 s/co ratio (ref 0.0–0.9)

## 2021-01-02 LAB — HIV ANTIBODY (ROUTINE TESTING W REFLEX): HIV Screen 4th Generation wRfx: NONREACTIVE

## 2021-01-02 LAB — VITAMIN B12: Vitamin B-12: 589 pg/mL (ref 232–1245)

## 2021-01-02 NOTE — Telephone Encounter (Signed)
Spoke to patient over telephone and reviewed her recent labs noting low Vitamin D. A supplement has been sent in to take weekly for this.  Also noted Positive ANA and elevation in CRP, normal ESR -- discussed with her possible Sjogren's or Lupus present + educated her on findings.  Discussed with her would need referral to rheumatology to further work-up and assess for treatment need.  She agrees with this plan of care and referral placed.  Reviewed imaging results with her noting arthritic changes to both lower back and right knee. Continue current at home treatment for this.  She stated appreciation for call.

## 2021-01-04 ENCOUNTER — Encounter: Payer: Self-pay | Admitting: Nurse Practitioner

## 2021-01-05 ENCOUNTER — Other Ambulatory Visit: Payer: Self-pay

## 2021-01-05 MED ORDER — CVS VITAMIN B-12 1000 MCG PO TABS: 1000.0000 ug | ORAL_TABLET | Freq: Every day | ORAL | 4 refills | Status: DC

## 2021-01-05 MED ORDER — NIKKI 3-0.02 MG PO TABS: 1.0000 | ORAL_TABLET | Freq: Every day | ORAL | 12 refills | Status: DC

## 2021-01-05 MED ORDER — HYDROCHLOROTHIAZIDE 25 MG PO TABS: 25.0000 mg | ORAL_TABLET | Freq: Every day | ORAL | 4 refills | Status: DC

## 2021-01-05 NOTE — Telephone Encounter (Signed)
Patient is asking for refills of HCTZ, [name], and [facility] B-12.  Patient was last seen on 12/31/20

## 2021-01-06 ENCOUNTER — Telehealth: Payer: Self-pay

## 2021-01-06 NOTE — Telephone Encounter (Signed)
CALLED PATIENT NO ANSWER LEFT VOICEMAIL FOR A CALL BACK ? ?

## 2021-01-08 ENCOUNTER — Telehealth: Payer: Self-pay

## 2021-01-08 ENCOUNTER — Other Ambulatory Visit: Payer: Self-pay

## 2021-01-08 DIAGNOSIS — Z1211 Encounter for screening for malignant neoplasm of colon: Secondary | ICD-10-CM

## 2021-01-08 MED ORDER — SUTAB 1479-225-188 MG PO TABS: 12.0000 | ORAL_TABLET | Freq: Once | ORAL | 0 refills | Status: AC

## 2021-01-08 NOTE — Telephone Encounter (Signed)
SCHEDULED 02/03/2021

## 2021-01-08 NOTE — Progress Notes (Signed)
Gastroenterology Pre-Procedure Review  Request Date: 02/03/2021 Requesting Physician: Dr. Servando Snare  PATIENT REVIEW QUESTIONS: The patient responded to the following health history questions as indicated:    1. Are you having any GI issues? no 2. Do you have a personal history of Polyps? no 3. Do you have a family history of Colon Cancer or Polyps? no 4. Diabetes Mellitus? no 5. Joint replacements in the past 12 months?no 6. Major health problems in the past 3 months?no 7. Any artificial heart valves, MVP, or defibrillator?no    MEDICATIONS & ALLERGIES:    Patient reports the following regarding taking any anticoagulation/antiplatelet therapy:   Plavix, Coumadin, Eliquis, Xarelto, Lovenox, Pradaxa, Brilinta, or Effient? no Aspirin? no  Patient confirms/reports the following medications:  Current Outpatient Medications  Medication Sig Dispense Refill   Cholecalciferol (VITAMIN D3) 1.25 MG (50000 UT) CAPS Take 1 capsule by mouth once a week.     [facility] VITAMIN B12 1000 MCG tablet Take 1 tablet (1,000 mcg total) by mouth daily. 90 tablet 4   gabapentin (NEURONTIN) 600 MG tablet Take 0.5 tablets (300 mg total) by mouth at bedtime. 90 tablet 4   hydrochlorothiazide (HYDRODIURIL) 25 MG tablet Take 1 tablet (25 mg total) by mouth daily. 90 tablet 4   [name] 3-0.02 MG tablet Take 1 tablet by mouth daily. 28 tablet 12   omeprazole (PRILOSEC) 20 MG capsule Take 1 capsule (20 mg total) by mouth daily. 90 capsule 4   No current facility-administered medications for this visit.    Patient confirms/reports the following allergies:  No Known Allergies  No orders of the defined types were placed in this encounter.   AUTHORIZATION INFORMATION Primary Insurance: 1D#: Group #:  Secondary Insurance: 1D#: Group #:  SCHEDULE INFORMATION: Date: 02/03/2021 Time: Location: [facility]

## 2021-01-28 ENCOUNTER — Encounter: Payer: Self-pay | Admitting: Nurse Practitioner

## 2021-01-28 ENCOUNTER — Other Ambulatory Visit: Payer: Self-pay

## 2021-01-28 ENCOUNTER — Ambulatory Visit (INDEPENDENT_AMBULATORY_CARE_PROVIDER_SITE_OTHER): Payer: BC Managed Care – PPO | Admitting: Nurse Practitioner

## 2021-01-28 VITALS — BP 126/84 | HR 82 | Temp 98.6°F | Ht 66.0 in | Wt 296.4 lb

## 2021-01-28 DIAGNOSIS — R7982 Elevated C-reactive protein (CRP): Secondary | ICD-10-CM | POA: Diagnosis not present

## 2021-01-28 DIAGNOSIS — K219 Gastro-esophageal reflux disease without esophagitis: Secondary | ICD-10-CM

## 2021-01-28 DIAGNOSIS — R768 Other specified abnormal immunological findings in serum: Secondary | ICD-10-CM

## 2021-01-28 DIAGNOSIS — N951 Menopausal and female climacteric states: Secondary | ICD-10-CM | POA: Diagnosis not present

## 2021-01-28 NOTE — Assessment & Plan Note (Signed)
Noted on labs and rheumatology referral in place, will check on this.

## 2021-01-28 NOTE — Assessment & Plan Note (Signed)
Chronic, improved with Prilosec.  Continue current regimen and adjust as needed.  Educated her on Prilosec and side effects.  If ongoing or worsening consider GI referral.

## 2021-01-28 NOTE — Assessment & Plan Note (Signed)
Chronic, ongoing -- is on birth control by GYN.  Would benefit discontinuation of this in upcoming years.  Discussed treatment options with patient.  Continue Gabapentin 300 MG QHS and adjust as needed, is benefiting from this.  Educated her on this medication and use + side effects.  Return to office in 6 weeks. Up to date on pap with GYN.  Check FSH/LH today.

## 2021-01-28 NOTE — Progress Notes (Signed)
BP 126/84    Pulse 82    Temp 98.6 F (37 C) (Oral)    Ht $R'5\' 6"'Gy$  (1.676 m)    Wt 296 lb 6.4 oz (134.4 kg)    SpO2 99%    BMI 47.84 kg/m    Subjective:    Patient ID: Ruth Hanson, female    DOB: [birth year], 50 y.o.   MRN: [id number]  HPI: Ruth Hanson is a 50 y.o. female  Chief Complaint  Patient presents with   Gastroesophageal Reflux   Menopause    Patient states she has been bleeding for about a month now filling a panty line up. Patient states she went to the bathroom today at work and she says it stopped. Patient states she is having discomfort from wearing the pads so long and states she does not have a yeast infection.    Daughter's number = Beverly Gust [phone]  GERD Changed to Prilosec last visit, was on Pepcid with no benefit.  This has offered benefit with the change. GERD control status:  controlled Satisfied with current treatment? yes Heartburn frequency: a few times a day Medication side effects: no  Medication compliance: stable Previous GERD medications: Pepcid Antacid use frequency:  none Nature: heavy in throat Location: throat Heartburn duration: 20 minutes Alleviatiating factors: bottle water Aggravating factors: eating Dysphagia: no Odynophagia:  no Hematemesis: no Blood in stool: no EGD: no    ARTHRITIS Ongoing issue for years.  Noted chronic back pain on dx list.  She reports mainly to right side -- right knee pops a lot and then groin area will have catch.  Notices pain to right back.  Pain is sharp and aching, she has to keep rubbing it to ease it.  Worst area is right knee to ankle.  Labs recent visit noted positive ANA and elevation in ESR.  A referral to rheumatology was placed, she has not heard from them yet. Pain control status: stable Duration: chronic Locations: right lower, right hip/groin, and right knee Quality: sharp and aching Current Pain Level: 6/10 Previous Pain Level: 7/10 Treatments: nothing What Activities task can  be accomplished with current medication? Has difficulty with squatting Previous testing: none Previous pain specialty evaluation: no Non-narcotic analgesic meds: no   MENOPAUSAL SYMPTOMS Last saw Dr. Cherylann Banas three months ago, when having kidney stone issues. Has become worse with menopause the last two years. Dr. Cherylann Banas has her on birth control to help regulate cycles and decrease flow -- has been on 3 months.  Started Gabapentin last visit for hot flashes -- having less hot flashes with this + sleeping better.  Reports currently has had cycle for a month which is now slowing down and just panty liner amount, scant.   Her mother stopped cycles at age 68. Gravida/Para: 2/2 Duration: uncontrolled Symptom severity: moderate Hot flashes: yes Night sweats: yes Sleep disturbances:  occasional Vaginal dryness: no Dyspareunia:no Decreased libido: yes Emotional lability: yes Stress incontinence: no Previous HRT/pharmacotherapy:  current birth control Hysterectomy: no Average interval between menses: 4 weeks Length of menses: 5 days, less heavy with birth control on board Flow: as above Dysmenorrhea: none GYN surgery: none Absolute Contraindications to Hormonal Therapy:     Undiagnosed vaginal bleeding: no    Breast cancer: no -- she has had biopsies    Endometrial cancer: no    Coronary disease: no    Cerebrovascular disease: yes -- her sister recently had a brain aneurysm     Venous thromboembolic disease: no  Relevant past medical, surgical, family and social history reviewed and updated as indicated. Interim medical history since our last visit reviewed. Allergies and medications reviewed and updated.  Review of Systems  Constitutional:  Negative for activity change, appetite change, diaphoresis, fatigue and fever.  Respiratory:  Negative for cough, chest tightness and shortness of breath.   Cardiovascular:  Negative for chest pain, palpitations and leg swelling.  Gastrointestinal:   Negative for abdominal distention, abdominal pain, constipation, diarrhea, nausea and vomiting.  Endocrine: Negative.   Musculoskeletal:  Positive for arthralgias.  Neurological: Negative.   Psychiatric/Behavioral: Negative.     Per HPI unless specifically indicated above     Objective:    BP 126/84    Pulse 82    Temp 98.6 F (37 C) (Oral)    Ht $R'5\' 6"'dD$  (1.676 m)    Wt 296 lb 6.4 oz (134.4 kg)    SpO2 99%    BMI 47.84 kg/m   Wt Readings from Last 3 Encounters:  01/28/21 296 lb 6.4 oz (134.4 kg)  12/31/20 293 lb 12.8 oz (133.3 kg)  11/04/20 (!) 305 lb (138.3 kg)    Physical Exam Vitals and nursing note reviewed.  Constitutional:      General: She is awake. She is not in acute distress.    Appearance: She is well-developed and well-groomed. She is obese. She is not ill-appearing or toxic-appearing.  HENT:     Head: Normocephalic.     Right Ear: Hearing normal.     Left Ear: Hearing normal.  Eyes:     General: Lids are normal.        Right eye: No discharge.        Left eye: No discharge.     Conjunctiva/sclera: Conjunctivae normal.     Pupils: Pupils are equal, round, and reactive to light.  Neck:     Thyroid: No thyromegaly.     Vascular: No carotid bruit.  Cardiovascular:     Rate and Rhythm: Normal rate and regular rhythm.     Heart sounds: Normal heart sounds. No murmur heard.   No gallop.  Pulmonary:     Effort: Pulmonary effort is normal. No accessory muscle usage or respiratory distress.     Breath sounds: Normal breath sounds.  Abdominal:     General: Bowel sounds are normal. There is no distension.     Palpations: Abdomen is soft.     Tenderness: There is no abdominal tenderness.     Hernia: No hernia is present.  Musculoskeletal:     Cervical back: Normal range of motion and neck supple.     Lumbar back: Normal.     Right hip: Normal.     Left hip: Normal.     Right knee: Crepitus present. No swelling or lacerations. Normal range of motion. No tenderness.      Left knee: No swelling, lacerations or crepitus. Normal range of motion. No tenderness.     Right lower leg: 1+ Edema present.     Left lower leg: 1+ Edema present.  Lymphadenopathy:     Cervical: No cervical adenopathy.  Skin:    General: Skin is warm and dry.  Neurological:     Mental Status: She is alert and oriented to person, place, and time.  Psychiatric:        Attention and Perception: Attention normal.        Mood and Affect: Mood normal.        Behavior: Behavior normal. Behavior is  cooperative.        Thought Content: Thought content normal.        Judgment: Judgment normal.   Results for orders placed or performed in visit on 12/31/20  ANA w/Reflex if Positive  Result Value Ref Range   Anti Nuclear Antibody (ANA) Positive (A) Negative   dsDNA Ab 5 0 - 9 IU/mL   ENA RNP Ab 7.0 (H) 0.0 - 0.9 AI   ENA SM Ab Ser-aCnc <0.2 0.0 - 0.9 AI   Scleroderma (Scl-70) (ENA) Antibody, IgG 0.3 0.0 - 0.9 AI   ENA SSA (RO) Ab 5.9 (H) 0.0 - 0.9 AI   ENA SSB (LA) Ab 0.2 0.0 - 0.9 AI   Chromatin Ab SerPl-aCnc 0.6 0.0 - 0.9 AI   Anti JO-1 <0.2 0.0 - 0.9 AI   Centromere Ab Screen <0.2 0.0 - 0.9 AI   See below: Comment   C-reactive protein  Result Value Ref Range   CRP 5 0 - 10 mg/L  Sed Rate (ESR)  Result Value Ref Range   Sed Rate 70 (H) 0 - 40 mm/hr  Lipid Panel w/o Chol/HDL Ratio  Result Value Ref Range   Cholesterol, Total 195 100 - 199 mg/dL   Triglycerides 104 0 - 149 mg/dL   HDL 85 >39 mg/dL   VLDL Cholesterol Cal 18 5 - 40 mg/dL   LDL Chol Calc (NIH) 92 0 - 99 mg/dL  TSH  Result Value Ref Range   TSH 1.190 0.450 - 4.500 uIU/mL  Comprehensive metabolic panel  Result Value Ref Range   Glucose 89 70 - 99 mg/dL   BUN 9 6 - 24 mg/dL   Creatinine, Ser 0.94 0.57 - 1.00 mg/dL   eGFR 73 >59 mL/min/1.73   BUN/Creatinine Ratio 10 9 - 23   Sodium 136 134 - 144 mmol/L   Potassium 4.0 3.5 - 5.2 mmol/L   Chloride 98 96 - 106 mmol/L   CO2 26 20 - 29 mmol/L   Calcium 9.5 8.7  - 10.2 mg/dL   Total Protein 7.9 6.0 - 8.5 g/dL   Albumin 4.0 3.8 - 4.9 g/dL   Globulin, Total 3.9 1.5 - 4.5 g/dL   Albumin/Globulin Ratio 1.0 (L) 1.2 - 2.2   Bilirubin Total 0.5 0.0 - 1.2 mg/dL   Alkaline Phosphatase 63 44 - 121 IU/L   AST 17 0 - 40 IU/L   ALT 18 0 - 32 IU/L  VITAMIN D 25 Hydroxy (Vit-D Deficiency, Fractures)  Result Value Ref Range   Vit D, 25-Hydroxy 97.3 30.0 - 100.0 ng/mL  Vitamin B12  Result Value Ref Range   Vitamin B-12 589 232 - 1,245 pg/mL  CBC with Differential/Platelet  Result Value Ref Range   WBC 5.9 3.4 - 10.8 x10E3/uL   RBC 4.91 3.77 - 5.28 x10E6/uL   Hemoglobin 11.8 11.1 - 15.9 g/dL   Hematocrit 37.4 34.0 - 46.6 %   MCV 76 (L) 79 - 97 fL   MCH 24.0 (L) 26.6 - 33.0 pg   MCHC 31.6 31.5 - 35.7 g/dL   RDW 14.2 11.7 - 15.4 %   Platelets 234 150 - 450 x10E3/uL   Neutrophils 63 Not Estab. %   Lymphs 27 Not Estab. %   Monocytes 8 Not Estab. %   Eos 1 Not Estab. %   Basos 1 Not Estab. %   Neutrophils Absolute 3.7 1.4 - 7.0 x10E3/uL   Lymphocytes Absolute 1.6 0.7 - 3.1 x10E3/uL   Monocytes Absolute 0.5 0.1 -  0.9 x10E3/uL   EOS (ABSOLUTE) 0.0 0.0 - 0.4 x10E3/uL   Basophils Absolute 0.0 0.0 - 0.2 x10E3/uL   Immature Granulocytes 0 Not Estab. %   Immature Grans (Abs) 0.0 0.0 - 0.1 x10E3/uL  Hepatitis C antibody  Result Value Ref Range   Hep C Virus Ab 0.2 0.0 - 0.9 s/co ratio  HIV Antibody (routine testing w rflx)  Result Value Ref Range   HIV Screen 4th Generation wRfx Non Reactive Non Reactive  HgB A1c  Result Value Ref Range   Hgb A1c MFr Bld 5.6 4.8 - 5.6 %   Est. average glucose Bld gHb Est-mCnc 114 mg/dL      Assessment & Plan:   Problem List Items Addressed This Visit       Digestive   Gastroesophageal reflux disease without esophagitis    Chronic, improved with Prilosec.  Continue current regimen and adjust as needed.  Educated her on Prilosec and side effects.  If ongoing or worsening consider GI referral.      Relevant  Medications   SUTAB [phone] MG TABS     Other   Elevated C-reactive protein (CRP)    Noted on labs and rheumatology referral in place, will check on this.      Menopausal symptoms - Primary    Chronic, ongoing -- is on birth control by GYN.  Would benefit discontinuation of this in upcoming years.  Discussed treatment options with patient.  Continue Gabapentin 300 MG QHS and adjust as needed, is benefiting from this.  Educated her on this medication and use + side effects.  Return to office in 6 weeks. Up to date on pap with GYN.  Check FSH/LH today.      Relevant Orders   CBC with Differential/Platelet   Iron, TIBC and Ferritin Panel   FSH/LH   Positive ANA (antinuclear antibody)    Noted on recent labs -- have referral to rheumatology placed and will check on this.        Follow up plan: Return in about 6 weeks (around 03/11/2021) for Menopause and ARTHRITIS.

## 2021-01-28 NOTE — Assessment & Plan Note (Signed)
Noted on recent labs -- have referral to rheumatology placed and will check on this.

## 2021-01-28 NOTE — Patient Instructions (Signed)
Menopause Menopause is the normal time of a woman's life when menstrual periods stop completely. It marks the natural end to a woman's ability to become pregnant. It can be defined as the absence of a menstrual period for 12 months without another medical cause. The transition to menopause (perimenopause) most often happens between the ages of 45 and 55, and can last for many years. During perimenopause, hormone levels change in your body, which can cause symptoms and affect your health. Menopause may increase your risk for: Weakened bones (osteoporosis), which causes fractures. Depression. Hardening and narrowing of the arteries (atherosclerosis), which can cause heart attacks and strokes. What are the causes? This condition is usually caused by a natural change in hormone levels that happens as you get older. The condition may also be caused by changes that are not natural, including: Surgery to remove both ovaries (surgical menopause). Side effects from some medicines, such as chemotherapy used to treat cancer (chemical menopause). What increases the risk? This condition is more likely to start at an earlier age if you have certain medical conditions or have undergone treatments, including: A tumor of the pituitary gland in the brain. A disease that affects the ovaries and hormones. Certain cancer treatments, such as chemotherapy or hormone therapy, or radiation therapy on the pelvis. Heavy smoking and excessive alcohol use. Family history of early menopause. This condition is also more likely to develop earlier in women who are very thin. What are the signs or symptoms? Symptoms of this condition include: Hot flashes. Irregular menstrual periods. Night sweats. Changes in feelings about sex. This could be a decrease in sex drive or an increased discomfort around your sexuality. Vaginal dryness and thinning of the vaginal walls. This may cause painful sex. Dryness of the skin and  development of wrinkles. Headaches. Problems sleeping (insomnia). Mood swings or irritability. Memory problems. Weight gain. Hair growth on the face and chest. Bladder infections or problems with urinating. How is this diagnosed? This condition is diagnosed based on your medical history, a physical exam, your age, your menstrual history, and your symptoms. Hormone tests may also be done. How is this treated? In some cases, no treatment is needed. You and your health care provider should make a decision together about whether treatment is necessary. Treatment will be based on your individual condition and preferences. Treatment for this condition focuses on managing symptoms. Treatment may include: Menopausal hormone therapy (MHT). Medicines to treat specific symptoms or complications. Acupuncture. Vitamin or herbal supplements. Before starting treatment, make sure to let your health care provider know if you have a personal or family history of these conditions: Heart disease. Breast cancer. Blood clots. Diabetes. Osteoporosis. Follow these instructions at home: Lifestyle Do not use any products that contain nicotine or tobacco, such as cigarettes, e-cigarettes, and chewing tobacco. If you need help quitting, ask your health care provider. Get at least 30 minutes of physical activity on 5 or more days each week. Avoid alcoholic and caffeinated beverages, as well as spicy foods. This may help prevent hot flashes. Get 7-8 hours of sleep each night. If you have hot flashes, try: Dressing in layers. Avoiding things that may trigger hot flashes, such as spicy food, warm places, or stress. Taking slow, deep breaths when a hot flash starts. Keeping a fan in your home and office. Find ways to manage stress, such as deep breathing, meditation, or journaling. Consider going to group therapy with other women who are having menopause symptoms. Ask your health care   provider about recommended  group therapy meetings. Eating and drinking  Eat a healthy, balanced diet that contains whole grains, lean protein, low-fat dairy, and plenty of fruits and vegetables. Your health care provider may recommend adding more soy to your diet. Foods that contain soy include tofu, tempeh, and soy milk. Eat plenty of foods that contain calcium and vitamin D for bone health. Items that are rich in calcium include low-fat milk, yogurt, beans, almonds, sardines, broccoli, and kale. Medicines Take over-the-counter and prescription medicines only as told by your health care provider. Talk with your health care provider before starting any herbal supplements. If prescribed, take vitamins and supplements as told by your health care provider. General instructions  Keep track of your menstrual periods, including: When they occur. How heavy they are and how long they last. How much time passes between periods. Keep track of your symptoms, noting when they start, how often you have them, and how long they last. Use vaginal lubricants or moisturizers to help with vaginal dryness and improve comfort during sex. Keep all follow-up visits. This is important. This includes any group therapy or counseling. Contact a health care provider if: You are still having menstrual periods after age 55. You have pain during sex. You have not had a period for 12 months and you develop vaginal bleeding. Get help right away if you have: Severe depression. Excessive vaginal bleeding. Pain when you urinate. A fast or irregular heartbeat (palpitations). Severe headaches. Abdominal pain or severe indigestion. Summary Menopause is a normal time of life when menstrual periods stop completely. It is usually defined as the absence of a menstrual period for 12 months without another medical cause. The transition to menopause (perimenopause) most often happens between the ages of 45 and 55 and can last for several years. Symptoms  can be managed through medicines, lifestyle changes, and complementary therapies such as acupuncture. Eat a balanced diet that is rich in nutrients to promote bone health and heart health and to manage symptoms during menopause. This information is not intended to replace advice given to you by your health care provider. Make sure you discuss any questions you have with your health care provider. Document Revised: 10/26/2019 Document Reviewed: 07/12/2019 Elsevier Patient Education  2022 [employer]vier Inc[employer]  

## 2021-01-29 LAB — CBC WITH DIFFERENTIAL/PLATELET
Basophils Absolute: 0.1 10*3/uL (ref 0.0–0.2)
Basos: 1 %
EOS (ABSOLUTE): 0 10*3/uL (ref 0.0–0.4)
Eos: 1 %
Hematocrit: 35.9 % (ref 34.0–46.6)
Hemoglobin: 11 g/dL — ABNORMAL LOW (ref 11.1–15.9)
Immature Grans (Abs): 0 10*3/uL (ref 0.0–0.1)
Immature Granulocytes: 0 %
Lymphocytes Absolute: 1.8 10*3/uL (ref 0.7–3.1)
Lymphs: 30 %
MCH: 23.7 pg — ABNORMAL LOW (ref 26.6–33.0)
MCHC: 30.6 g/dL — ABNORMAL LOW (ref 31.5–35.7)
MCV: 77 fL — ABNORMAL LOW (ref 79–97)
Monocytes Absolute: 0.4 10*3/uL (ref 0.1–0.9)
Monocytes: 7 %
Neutrophils Absolute: 3.8 10*3/uL (ref 1.4–7.0)
Neutrophils: 61 %
Platelets: 222 10*3/uL (ref 150–450)
RBC: 4.64 x10E6/uL (ref 3.77–5.28)
RDW: 13.9 % (ref 11.7–15.4)
WBC: 6.1 10*3/uL (ref 3.4–10.8)

## 2021-01-29 LAB — FSH/LH
FSH: 1.5 m[IU]/mL
LH: <0.3 m[IU]/mL

## 2021-01-29 LAB — IRON,TIBC AND FERRITIN PANEL
Ferritin: 21 ng/mL (ref 15–150)
Iron Saturation: 25 % (ref 15–55)
Iron: 98 ug/dL (ref 27–159)
Total Iron Binding Capacity: 387 ug/dL (ref 250–450)
UIBC: 289 ug/dL (ref 131–425)

## 2021-01-29 NOTE — Progress Notes (Signed)
Contacted via MyChart   Good evening Ruth Hanson, your labs have returned.  Your hemoglobin is a little low, but hematocrit and iron normal.  I would recommend taking a daily multivitamin right now though due to recent longer cycle.  Hormone labs do not show post menopausal levels yet, we will continue to monitor your symptoms.  I do recommend you call rheumatology at [phone] to schedule appointment with them for further work-up as we discussed.  Any questions? Keep being wonderful!!  Thank you for allowing me to participate in your care.  I appreciate you. Kindest regards, Becky Colan

## 2021-02-03 ENCOUNTER — Encounter: Payer: Self-pay | Admitting: Nurse Practitioner

## 2021-02-03 ENCOUNTER — Encounter
Admission: RE | Disposition: A | Discharge status: Home / Self Care | Payer: Self-pay | Source: Home / Self Care | Attending: Gastroenterology

## 2021-02-03 ENCOUNTER — Ambulatory Visit: Payer: BC Managed Care – PPO | Admitting: Anesthesiology

## 2021-02-03 ENCOUNTER — Encounter: Payer: Self-pay | Admitting: Gastroenterology

## 2021-02-03 ENCOUNTER — Ambulatory Visit
Admission: RE | Admit: 2021-02-03 | Discharge: 2021-02-03 | Disposition: A | Discharge status: Home / Self Care | Payer: BC Managed Care – PPO | Attending: Gastroenterology | Admitting: Gastroenterology

## 2021-02-03 DIAGNOSIS — I10 Essential (primary) hypertension: Secondary | ICD-10-CM | POA: Insufficient documentation

## 2021-02-03 DIAGNOSIS — Z1211 Encounter for screening for malignant neoplasm of colon: Secondary | ICD-10-CM | POA: Diagnosis not present

## 2021-02-03 DIAGNOSIS — K219 Gastro-esophageal reflux disease without esophagitis: Secondary | ICD-10-CM | POA: Diagnosis not present

## 2021-02-03 DIAGNOSIS — Z6841 Body Mass Index (BMI) 40.0 and over, adult: Secondary | ICD-10-CM | POA: Diagnosis not present

## 2021-02-03 DIAGNOSIS — M1990 Unspecified osteoarthritis, unspecified site: Secondary | ICD-10-CM | POA: Insufficient documentation

## 2021-02-03 HISTORY — PX: COLONOSCOPY WITH PROPOFOL: SHX5780

## 2021-02-03 LAB — POCT PREGNANCY, URINE: Preg Test, Ur: NEGATIVE

## 2021-02-03 SURGERY — COLONOSCOPY WITH PROPOFOL
Anesthesia: General

## 2021-02-03 MED ORDER — MIDAZOLAM HCL 2 MG/2ML IJ SOLN
INTRAMUSCULAR | Status: AC
  Filled 2021-02-03: qty 2

## 2021-02-03 MED ORDER — PROPOFOL 500 MG/50ML IV EMUL
INTRAVENOUS | Status: AC
  Filled 2021-02-03: qty 150

## 2021-02-03 MED ORDER — PROPOFOL 500 MG/50ML IV EMUL
INTRAVENOUS | Status: DC | PRN
  Administered 2021-02-03: 100 ug/kg/min via INTRAVENOUS

## 2021-02-03 MED ORDER — FENTANYL CITRATE (PF) 100 MCG/2ML IJ SOLN
INTRAMUSCULAR | Status: AC
  Filled 2021-02-03: qty 2

## 2021-02-03 MED ORDER — SODIUM CHLORIDE 0.9 % IV SOLN
INTRAVENOUS | Status: DC
  Administered 2021-02-03: 08:00:00 1000 mL via INTRAVENOUS

## 2021-02-03 MED ORDER — PROPOFOL 10 MG/ML IV BOLUS
INTRAVENOUS | Status: DC | PRN
  Administered 2021-02-03: 100 mg via INTRAVENOUS

## 2021-02-03 MED ORDER — PHENYLEPHRINE 40 MCG/ML (10ML) SYRINGE FOR IV PUSH (FOR BLOOD PRESSURE SUPPORT)
PREFILLED_SYRINGE | INTRAVENOUS | Status: DC | PRN
  Administered 2021-02-03: 100 ug via INTRAVENOUS

## 2021-02-03 MED ORDER — MIDAZOLAM HCL 2 MG/2ML IJ SOLN
INTRAMUSCULAR | Status: DC | PRN
  Administered 2021-02-03: 2 mg via INTRAVENOUS

## 2021-02-03 MED ORDER — FENTANYL CITRATE (PF) 100 MCG/2ML IJ SOLN
INTRAMUSCULAR | Status: DC | PRN
  Administered 2021-02-03 (×4): 25 ug via INTRAVENOUS

## 2021-02-03 NOTE — H&P (Signed)
Ruth Minium, MD [facility] [address] [place], [place] [address] Phone: [phone] Fax : [phone]  Primary Care Physician:  Marjie Skiff, NP Primary Gastroenterologist:  Dr. Servando Snare  Pre-Procedure History & Physical: HPI:  Ruth Hanson is a 50 y.o. female is here for a screening colonoscopy.   History reviewed. No pertinent past medical history.  Past Surgical History:  Procedure Laterality Date   BREAST BIOPSY Left 12/19/2019   6:00 4cmfn x clip path pending    Prior to Admission medications   Medication Sig Start Date End Date Taking? Authorizing Provider  Cholecalciferol (VITAMIN D3) 1.25 MG (50000 UT) CAPS Take 1 capsule by mouth once a week. 11/29/20  Yes [provider]  [facility] VITAMIN B12 1000 MCG tablet Take 1 tablet (1,000 mcg total) by mouth daily. 01/05/21  Yes [name], [name], NP  gabapentin (NEURONTIN) 600 MG tablet Take 0.5 tablets (300 mg total) by mouth at bedtime. 12/31/20  Yes [name], [name], NP  hydrochlorothiazide (HYDRODIURIL) 25 MG tablet Take 1 tablet (25 mg total) by mouth daily. 01/05/21  Yes [name], [name], NP  [name] 3-0.02 MG tablet Take 1 tablet by mouth daily. 01/05/21  Yes [name], [name], NP  omeprazole (PRILOSEC) 20 MG capsule Take 1 capsule (20 mg total) by mouth daily. 12/31/20  Yes [name], Corrie Dandy T, NP  SUTAB [phone] MG TABS Take by mouth. 01/08/21  Yes [provider]    Allergies as of 01/08/2021   (No Known Allergies)    Family History  Problem Relation Age of Onset   Cancer Mother 52       stomach   Hypertension Mother    Aneurysm Sister    GER disease Brother     Social History   Socioeconomic History   Marital status: Single    Spouse name: Not on file   Number of children: Not on file   Years of education: Not on file   Highest education level: Not on file  Occupational History   Not on file  Tobacco Use   Smoking status: Never   Smokeless tobacco: Never  Vaping Use    Vaping Use: Never used  Substance and Sexual Activity   Alcohol use: Never   Drug use: Never   Sexual activity: Not on file  Other Topics Concern   Not on file  Social History Narrative   Not on file   Social Determinants of Health   Financial Resource Strain: Low Risk    Difficulty of Paying Living Expenses: Not hard at all  Food Insecurity: No Food Insecurity   Worried About Programme researcher, broadcasting/film/video in the Last Year: Never true   Ran Out of Food in the Last Year: Never true  Transportation Needs: No Transportation Needs   Lack of Transportation (Medical): No   Lack of Transportation (Non-Medical): No  Physical Activity: Inactive   Days of Exercise per Week: 0 days   Minutes of Exercise per Session: 0 min  Stress: No Stress Concern Present   Feeling of Stress : Only a little  Social Connections: Socially Isolated   Frequency of Communication with Friends and Family: Twice a week   Frequency of Social Gatherings with Friends and Family: Twice a week   Attends Religious Services: Never   Database administrator or Organizations: No   Attends Banker Meetings: Never   Marital Status: Separated  Intimate Partner Violence: Not At Risk   Fear of Current or  Ex-Partner: No   Emotionally Abused: No   Physically Abused: No   Sexually Abused: No    Review of Systems: See HPI, otherwise negative ROS  Physical Exam: BP (!) 152/91    Pulse 100    Temp (!) 96.8 F (36 C) (Temporal)    Resp 20    Ht 5\' 6"  (1.676 m)    Wt 134.5 kg    LMP 02/02/2021 Comment: Pregnancy test negative   SpO2 100%    BMI 47.87 kg/m  General:   Alert,  pleasant and cooperative in NAD Head:  Normocephalic and atraumatic. Neck:  Supple; no masses or thyromegaly. Lungs:  Clear throughout to auscultation.    Heart:  Regular rate and rhythm. Abdomen:  Soft, nontender and nondistended. Normal bowel sounds, without guarding, and without rebound.   Neurologic:  Alert and  oriented x4;  grossly normal  neurologically.  Impression/Plan: [name] is now here to undergo a screening colonoscopy.  Risks, benefits, and alternatives regarding colonoscopy have been reviewed with the patient.  Questions have been answered.  All parties agreeable.

## 2021-02-03 NOTE — Transfer of Care (Signed)
Immediate Anesthesia Transfer of Care Note  Patient: Ruth Hanson  Procedure(s) Performed: COLONOSCOPY WITH PROPOFOL  Patient Location: PACU  Anesthesia Type:General  Level of Consciousness: awake, alert  and oriented  Airway & Oxygen Therapy: Patient Spontanous Breathing and Patient connected to nasal cannula oxygen  Post-op Assessment: Report given to RN, Post -op Vital signs reviewed and stable and Patient moving all extremities  Post vital signs: Reviewed and stable  Last Vitals:  Vitals Value Taken Time  BP 113/72 02/03/21 0825  Temp    Pulse 75 02/03/21 0826  Resp 21 02/03/21 0826  SpO2 100 % 02/03/21 0826  Vitals shown include unvalidated device data.  Last Pain:  Vitals:   02/03/21 0740  TempSrc: Temporal  PainSc: 0-No pain         Complications: No notable events documented.

## 2021-02-03 NOTE — Anesthesia Preprocedure Evaluation (Signed)
Anesthesia Evaluation  Patient identified by MRN, date of birth, ID band Patient awake    Reviewed: Allergy & Precautions, NPO status , Patient's Chart, lab work & pertinent test results  Airway Mallampati: III  TM Distance: >3 FB Neck ROM: Full    Dental no notable dental hx.    Pulmonary neg pulmonary ROS,    Pulmonary exam normal        Cardiovascular hypertension (Pt Denies, taken off BP Meds), Normal cardiovascular exam     Neuro/Psych negative neurological ROS  negative psych ROS   GI/Hepatic Neg liver ROS, Bowel prep,GERD  Medicated,  Endo/Other  Morbid obesity  Renal/GU negative Renal ROS  negative genitourinary   Musculoskeletal  (+) Arthritis ,   Abdominal   Peds negative pediatric ROS (+)  Hematology negative hematology ROS (+)   Anesthesia Other Findings   Reproductive/Obstetrics negative OB ROS                            Anesthesia Physical Anesthesia Plan  ASA: 2  Anesthesia Plan: General   Post-op Pain Management:    Induction: Intravenous  PONV Risk Score and Plan: 2 and Propofol infusion and TIVA  Airway Management Planned: Natural Airway and Nasal Cannula  Additional Equipment:   Intra-op Plan:   Post-operative Plan:   Informed Consent: I have reviewed the patients History and Physical, chart, labs and discussed the procedure including the risks, benefits and alternatives for the proposed anesthesia with the patient or authorized representative who has indicated his/her understanding and acceptance.       Plan Discussed with: CRNA, Anesthesiologist and Surgeon  Anesthesia Plan Comments:         Anesthesia Quick Evaluation

## 2021-02-03 NOTE — Anesthesia Postprocedure Evaluation (Signed)
Anesthesia Post Note  Patient: Ruth Hanson  Procedure(s) Performed: COLONOSCOPY WITH PROPOFOL  Patient location during evaluation: Phase II Anesthesia Type: General Level of consciousness: awake and alert, awake and oriented Pain management: pain level controlled Vital Signs Assessment: post-procedure vital signs reviewed and stable Respiratory status: spontaneous breathing, nonlabored ventilation and respiratory function stable Cardiovascular status: blood pressure returned to baseline and stable Postop Assessment: no apparent nausea or vomiting Anesthetic complications: no   No notable events documented.   Last Vitals:  Vitals:   02/03/21 0830 02/03/21 0840  BP: 113/72 120/76  Pulse: 71 (!) 42  Resp: 19 18  Temp:    SpO2: 100% 100%    Last Pain:  Vitals:   02/03/21 0740  TempSrc: Temporal  PainSc: 0-No pain                 Manfred Arch

## 2021-02-03 NOTE — Op Note (Signed)
[facility] Gastroenterology Patient Name: Ruth Hanson Procedure Date: 02/03/2021 6:49 AM MRN: [id number] Account #: [id number] Date of Birth: 11-12-1970 Admit Type: Outpatient Age: 50 Room: [facility] ENDO ROOM 4 Gender: Female Note Status: Finalized Instrument Name: Nelda Marseille [id number] Procedure:             Colonoscopy Indications:           Screening for colorectal malignant neoplasm Providers:             [name] MD, MD Referring MD:          Dorie Rank. [name] (Referring MD) Medicines:             Propofol per Anesthesia Complications:         No immediate complications. Procedure:             Pre-Anesthesia Assessment:                        - Prior to the procedure, a History and Physical was                         performed, and patient medications and allergies were                         reviewed. The patient's tolerance of previous                         anesthesia was also reviewed. The risks and benefits                         of the procedure and the sedation options and risks                         were discussed with the patient. All questions were                         answered, and informed consent was obtained. Prior                         Anticoagulants: The patient has taken no previous                         anticoagulant or antiplatelet agents. ASA Grade                         Assessment: II - A patient with mild systemic disease.                         After reviewing the risks and benefits, the patient                         was deemed in satisfactory condition to undergo the                         procedure.                        After obtaining informed consent, the colonoscope was  passed under direct vision. Throughout the procedure,                         the patient's blood pressure, pulse, and oxygen                         saturations were monitored continuously. The                          Colonoscope was introduced through the anus and                         advanced to the the cecum, identified by appendiceal                         orifice and ileocecal valve. The colonoscopy was                         performed without difficulty. The patient tolerated                         the procedure well. The quality of the bowel                         preparation was excellent. Findings:      The perianal and digital rectal examinations were normal.      The colon (entire examined portion) appeared normal. Impression:            - The entire examined colon is normal.                        - No specimens collected. Recommendation:        - Discharge patient to home.                        - Resume previous diet.                        - Continue present medications.                        - Repeat colonoscopy in 10 years for screening                         purposes. Procedure Code(s):     --- Professional ---                        [phone], Colonoscopy, flexible; diagnostic, including                         collection of specimen(s) by brushing or washing, when                         performed (separate procedure) Diagnosis Code(s):     --- Professional ---                        Z12.11, Encounter for screening for malignant neoplasm  of colon CPT copyright 2019 American Medical Association. All rights reserved. The codes documented in this report are preliminary and upon coder review may  be revised to meet current compliance requirements. [name] MD, MD 02/03/2021 8:19:12 AM This report has been signed electronically. Number of Addenda: 0 Note Initiated On: 02/03/2021 6:49 AM Scope Withdrawal Time: 0 hours 10 minutes 11 seconds  Total Procedure Duration: 0 hours 14 minutes 36 seconds  Estimated Blood Loss:  Estimated blood loss: none.      [facility]

## 2021-02-04 ENCOUNTER — Encounter: Payer: Self-pay | Admitting: Gastroenterology

## 2021-03-03 DIAGNOSIS — M1711 Unilateral primary osteoarthritis, right knee: Secondary | ICD-10-CM | POA: Insufficient documentation

## 2021-03-03 DIAGNOSIS — M5136 Other intervertebral disc degeneration, lumbar region: Secondary | ICD-10-CM | POA: Insufficient documentation

## 2021-03-03 DIAGNOSIS — M7061 Trochanteric bursitis, right hip: Secondary | ICD-10-CM | POA: Diagnosis not present

## 2021-03-03 DIAGNOSIS — R768 Other specified abnormal immunological findings in serum: Secondary | ICD-10-CM | POA: Diagnosis not present

## 2021-03-07 DIAGNOSIS — E559 Vitamin D deficiency, unspecified: Secondary | ICD-10-CM | POA: Insufficient documentation

## 2021-03-07 NOTE — Patient Instructions (Addendum)
Start Magnesium supplement 400 MG at night.  Ask about [facility] for weight loss.  Osteoarthritis Osteoarthritis is a type of arthritis. It refers to joint pain or joint disease. Osteoarthritis affects tissue that covers the ends of bones in joints (cartilage). Cartilage acts as a cushion between the bones and helps them move smoothly. Osteoarthritis occurs when cartilage in the joints gets worn down. Osteoarthritis is sometimes called "wear and tear" arthritis. Osteoarthritis is the most common form of arthritis. It often occurs in older people. It is a condition that gets worse over time. The joints most often affected by this condition are in the fingers, toes, hips, knees, and spine, including the neck and lower back. What are the causes? This condition is caused by the wearing down of cartilage that covers the ends of bones. What increases the risk? The following factors may make you more likely to develop this condition: Being age 52 or older. Obesity. Overuse of joints. Past injury of a joint. Past surgery on a joint. Family history of osteoarthritis. What are the signs or symptoms? The main symptoms of this condition are pain, swelling, and stiffness in the joint. Other symptoms may include: An enlarged joint. More pain and further damage caused by small pieces of bone or cartilage that break off and float inside of the joint. Small deposits of bone (osteophytes) that grow on the edges of the joint. A grating or scraping feeling inside the joint when you move it. Popping or creaking sounds when you move. Difficulty walking or exercising. An inability to grip items, twist your hand(s), or control the movements of your hands and fingers. How is this diagnosed? This condition may be diagnosed based on: Your medical history. A physical exam. Your symptoms. X-rays of the affected joint(s). Blood tests to rule out other types of arthritis. How is this treated? There is no cure for  this condition, but treatment can help control pain and improve joint function. Treatment may include a combination of therapies, such as: Pain relief techniques, such as: Applying heat and cold to the joint. Massage. A form of talk therapy called cognitive behavioral therapy (CBT). This therapy helps you set goals and follow up on the changes that you make. Medicines for pain and inflammation. The medicines can be taken by mouth or applied to the skin. They include: NSAIDs, such as ibuprofen. Prescription medicines. Strong anti-inflammatory medicines (corticosteroids). Certain nutritional supplements. A prescribed exercise program. You may work with a physical therapist. Assistive devices, such as a brace, wrap, splint, specialized glove, or cane. A weight control plan. Surgery, such as: An osteotomy. This is done to reposition the bones and relieve pain or to remove loose pieces of bone and cartilage. Joint replacement surgery. You may need this surgery if you have advanced osteoarthritis. Follow these instructions at home: Activity Rest your affected joints as told by your health care provider. Exercise as told by your health care provider. He or she may recommend specific types of exercise, such as: Strengthening exercises. These are done to strengthen the muscles that support joints affected by arthritis. Aerobic activities. These are exercises, such as brisk walking or water aerobics, that increase your heart rate. Range-of-motion activities. These help your joints move more easily. Balance and agility exercises. Managing pain, stiffness, and swelling   If directed, apply heat to the affected area as often as told by your health care provider. Use the heat source that your health care provider recommends, such as a moist heat pack or  a heating pad. If you have a removable assistive device, remove it as told by your health care provider. Place a towel between your skin and the heat  source. If your health care provider tells you to keep the assistive device on while you apply heat, place a towel between the assistive device and the heat source. Leave the heat on for 20-30 minutes. Remove the heat if your skin turns bright red. This is especially important if you are unable to feel pain, heat, or cold. You may have a greater risk of getting burned. If directed, put ice on the affected area. To do this: If you have a removable assistive device, remove it as told by your health care provider. Put ice in a plastic bag. Place a towel between your skin and the bag. If your health care provider tells you to keep the assistive device on during icing, place a towel between the assistive device and the bag. Leave the ice on for 20 minutes, 2-3 times a day. Move your fingers or toes often to reduce stiffness and swelling. Raise (elevate) the injured area above the level of your heart while you are sitting or lying down. General instructions Take over-the-counter and prescription medicines only as told by your health care provider. Maintain a healthy weight. Follow instructions from your health care provider for weight control. Do not use any products that contain nicotine or tobacco, such as cigarettes, e-cigarettes, and chewing tobacco. If you need help quitting, ask your health care provider. Use assistive devices as told by your health care provider. Keep all follow-up visits as told by your health care provider. This is important. Where to find more information [employer] of Arthritis and Musculoskeletal and Skin Diseases: www.niams.SouthExposed.es [employer] on Aging: http://kim-miller.com/ American College of Rheumatology: www.rheumatology.org Contact a health care provider if: You have redness, swelling, or a feeling of warmth in a joint that gets worse. You have a fever along with joint or muscle aches. You develop a rash. You have trouble doing your normal  activities. Get help right away if: You have pain that gets worse and is not relieved by pain medicine. Summary Osteoarthritis is a type of arthritis that affects tissue covering the ends of bones in joints (cartilage). This condition is caused by the wearing down of cartilage that covers the ends of bones. The main symptom of this condition is pain, swelling, and stiffness in the joint. There is no cure for this condition, but treatment can help control pain and improve joint function. This information is not intended to replace advice given to you by your health care provider. Make sure you discuss any questions you have with your health care provider. Document Revised: 01/22/2019 Document Reviewed: 01/22/2019 Elsevier Patient Education  [place].

## 2021-03-11 ENCOUNTER — Other Ambulatory Visit: Payer: Self-pay

## 2021-03-11 ENCOUNTER — Ambulatory Visit: Payer: BC Managed Care – PPO | Admitting: Nurse Practitioner

## 2021-03-11 ENCOUNTER — Encounter: Payer: Self-pay | Admitting: Nurse Practitioner

## 2021-03-11 VITALS — BP 123/75 | HR 73 | Temp 98.3°F | Ht 66.0 in | Wt 291.6 lb

## 2021-03-11 DIAGNOSIS — R252 Cramp and spasm: Secondary | ICD-10-CM | POA: Insufficient documentation

## 2021-03-11 DIAGNOSIS — I10 Essential (primary) hypertension: Secondary | ICD-10-CM

## 2021-03-11 DIAGNOSIS — Z6841 Body Mass Index (BMI) 40.0 and over, adult: Secondary | ICD-10-CM

## 2021-03-11 DIAGNOSIS — M159 Polyosteoarthritis, unspecified: Secondary | ICD-10-CM | POA: Diagnosis not present

## 2021-03-11 DIAGNOSIS — R768 Other specified abnormal immunological findings in serum: Secondary | ICD-10-CM

## 2021-03-11 DIAGNOSIS — N951 Menopausal and female climacteric states: Secondary | ICD-10-CM

## 2021-03-11 NOTE — Assessment & Plan Note (Signed)
BMI 47.07 with HTN and GERD + OA.  Highly recommend weight loss, she is going to look into coverage for [facility] with insurance -- if needed will do PA to get coverage for this.  She will reach out to provider.  Recommended eating smaller high protein, low fat meals more frequently and exercising 30 mins a day 5 times a week with a goal of 10-15lb weight loss in the next 3 months. Patient voiced their understanding and motivation to adhere to these recommendations.

## 2021-03-11 NOTE — Assessment & Plan Note (Signed)
Ongoing for weeks, recommend to start out with Magnesium 400 MG QHS + good hydration.  If worsening consider prescription medication and further labs.

## 2021-03-11 NOTE — Assessment & Plan Note (Signed)
Chronic, ongoing with BP at goal in office today.  Recommend she monitor BP at least a few mornings a week at home and document.  DASH diet at home.  Continue current medication regimen and adjust as needed.  Labs up to date.

## 2021-03-11 NOTE — Assessment & Plan Note (Signed)
Noted on labs, followed by rheumatology at this time, continue this collaboration.

## 2021-03-11 NOTE — Progress Notes (Signed)
° °BP 123/75    Pulse 73    Temp 98.3 °F (36.8 °C) (Oral)    Ht 5' 6" (1.676 m)    Wt 291 lb 9.6 oz (132.3 kg)    SpO2 98%    BMI 47.07 kg/m²   ° °Subjective:  ° ° Patient ID: Ruth Hanson, female    DOB: 10/18/1970, 50 y.o.   MRN: [id number] ° °HPI: °Ruth Hanson is a 50 y.o. female ° °Chief Complaint  °Patient presents with  ° Menopause  ° Arthritis  ° Spasms  °  Patient states she has noticed episodes of having muscle spasms. Patient states she noticed them if she is reaching up to grab something. Patient states she notices in her right lower side.   ° °ARTHRITIS °Follow-up today.  Ongoing issue for years. Reports mainly to right side -- right knee pops a lot and then groin area will have catch. Notices pain to right back.  Pain is sharp and aching, she has to keep rubbing it to ease it.   ° °Worst area is right knee to ankle.  Labs noted positive ANA and elevation in ESR at initial visit with PCP.  She saw rheumatology for initial visit on 03/03/21 with Meloxicam added to regimen for pain + right hip steroid injection.  She is to return to see them in 4 months.  She reports improvement in pain for hip -- she reports Meloxicam helps with pain.  Is working on weight loss after discussion with rheumatology who highly recommended this to avoid worsening arthritis pain. °Pain control status: stable °Duration: chronic °Locations: right lower, right hip/groin, and right knee °Quality: sharp and aching °Current Pain Level: 6/10 °Previous Pain Level: 6/10 °Treatments: nothing °What Activities task can be accomplished with current medication? Has difficulty with squatting °Previous testing: none °Previous pain specialty evaluation: no °Non-narcotic analgesic meds: no ° °RESTLESS LEGS °Has leg cramps every other night for two weeks + occasionally during day gets a catch in her right side with reaching up.  Has been using mustard at home.  She has been drinking more water, Gatorade, and juice. °Duration:  weeks °Discomfort description:  cramping °Pain: yes °Location: lower legs °Bilateral: yes °Symmetric: yes °Severity: 10/10 °Onset:  sudden °Frequency:  intermittent °Symptoms only occur while legs at rest: yes °Sudden unintentional leg jerking: no °Bed partner bothered by leg movements: no °LE numbness: no °Decreased sensation: no °Weakness: no °Insomnia: yes °Daytime somnolence: no °Fatigue: yes °Alleviating factors: shaking legs out and mustard °Aggravating factors: none °Status: fluctuating °Treatments attempted:  ° °MENOPAUSAL SYMPTOMS °Follow-up today.  Taking Gabapentin 300 MG every night at this time with benefit.  Worse with menopause the last two years. Dr. KJ has her on birth control to help regulate cycles and decrease flow -- has been on 3 months.   °  °Last cycle was on 01/28/21 -- has not had one this month as of yet. °  °Her mother stopped cycles at age 52. °Gravida/Para: 2/2 °Duration: uncontrolled °Symptom severity: moderate °Hot flashes: yes °Night sweats: yes °Sleep disturbances:  occasional °Vaginal dryness: no °Dyspareunia:no °Decreased libido: yes °Emotional lability: yes °Stress incontinence: no °Previous HRT/pharmacotherapy:  current birth control °Hysterectomy: no °Average interval between menses: 4 weeks °Length of menses: 5 days, less heavy with birth control on board °Flow: as above °Dysmenorrhea: none °GYN surgery: none °Absolute Contraindications to Hormonal Therapy:  °   Undiagnosed vaginal bleeding: no °   Breast cancer: no -- she has had   had biopsies    Endometrial cancer: no    Coronary disease: no    Cerebrovascular disease: yes -- her sister recently had a brain aneurysm     Venous thromboembolic disease: no   Relevant past medical, surgical, family and social history reviewed and updated as indicated. Interim medical history since our last visit reviewed. Allergies and medications reviewed and updated.  Review of Systems  Constitutional:  Negative for activity change,  appetite change, diaphoresis, fatigue and fever.  Respiratory:  Negative for cough, chest tightness and shortness of breath.   Cardiovascular:  Negative for chest pain, palpitations and leg swelling.  Gastrointestinal:  Negative for abdominal distention, abdominal pain, constipation, diarrhea, nausea and vomiting.  Endocrine: Negative.   Musculoskeletal:  Positive for arthralgias.  Neurological: Negative.   Psychiatric/Behavioral: Negative.     Per HPI unless specifically indicated above     Objective:    BP 123/75    Pulse 73    Temp 98.3 F (36.8 C) (Oral)    Ht 5' 6" (1.676 m)    Wt 291 lb 9.6 oz (132.3 kg)    SpO2 98%    BMI 47.07 kg/m   Wt Readings from Last 3 Encounters:  03/11/21 291 lb 9.6 oz (132.3 kg)  02/03/21 296 lb 9.7 oz (134.5 kg)  01/28/21 296 lb 6.4 oz (134.4 kg)    Physical Exam Vitals and nursing note reviewed.  Constitutional:      General: She is awake. She is not in acute distress.    Appearance: She is well-developed and well-groomed. She is obese. She is not ill-appearing or toxic-appearing.  HENT:     Head: Normocephalic.     Right Ear: Hearing normal.     Left Ear: Hearing normal.  Eyes:     General: Lids are normal.        Right eye: No discharge.        Left eye: No discharge.     Conjunctiva/sclera: Conjunctivae normal.     Pupils: Pupils are equal, round, and reactive to light.  Neck:     Thyroid: No thyromegaly.     Vascular: No carotid bruit.  Cardiovascular:     Rate and Rhythm: Normal rate and regular rhythm.     Heart sounds: Normal heart sounds. No murmur heard.   No gallop.  Pulmonary:     Effort: Pulmonary effort is normal. No accessory muscle usage or respiratory distress.     Breath sounds: Normal breath sounds.  Abdominal:     General: Bowel sounds are normal. There is no distension.     Palpations: Abdomen is soft.     Tenderness: There is no abdominal tenderness.     Hernia: No hernia is present.  Musculoskeletal:      Cervical back: Normal range of motion and neck supple.     Lumbar back: Normal.     Right lower leg: 1+ Edema present.     Left lower leg: 1+ Edema present.  Lymphadenopathy:     Cervical: No cervical adenopathy.  Skin:    General: Skin is warm and dry.  Neurological:     Mental Status: She is alert and oriented to person, place, and time.  Psychiatric:        Attention and Perception: Attention normal.        Mood and Affect: Mood normal.        Behavior: Behavior normal. Behavior is cooperative.        Thought Content:  content normal.     °   Judgment: Judgment normal.  ° °Results for orders placed or performed during the hospital encounter of 02/03/21  °Pregnancy, urine POC  °Result Value Ref Range  ° Preg Test, Ur NEGATIVE NEGATIVE  ° °   °Assessment & Plan:  ° °Problem List Items Addressed This Visit   ° °  ° Cardiovascular and Mediastinum  ° Essential hypertension  °  Chronic, ongoing with BP at goal in office today.  Recommend she monitor BP at least a few mornings a week at home and document.  DASH diet at home.  Continue current medication regimen and adjust as needed.  Labs up to date.   °  °  °  ° Musculoskeletal and Integument  ° Primary osteoarthritis involving multiple joints  °  To left knee, hip, and lower back.  Suspect more OA, but had positive ANA.  Continue Meloxicam and collaboration with rheumatology at this time.  May take Tylenol as needed, up to 3000 MG daily + recommend use of Voltaren gel as needed.  Wear knee support when on feet long hours.  Consider PT referral or right knee steroid injection if ongoing or worsening.   °  °  ° Relevant Medications  ° meloxicam (MOBIC) 7.5 MG tablet  °  ° Other  ° Leg cramps  °  Ongoing for weeks, recommend to start out with Magnesium 400 MG QHS + good hydration.  If worsening consider prescription medication and further labs. °  °  ° Menopausal symptoms  °  Chronic, ongoing -- is on birth control by GYN.  Would benefit  discontinuation of this in upcoming months, discussed with her.  Discussed treatment options with patient.  Continue Gabapentin 300 MG QHS and adjust as needed, is benefiting from this.  Educated her on this medication and use + side effects.  Return to office in 6 months. Up to date on pap with GYN.   °  °  ° Obesity - Primary  °  BMI 47.07 with HTN and GERD + OA.  Highly recommend weight loss, she is going to look into coverage for [employer] with insurance -- if needed will do PA to get coverage for this.  She will reach out to provider.  Recommended eating smaller high protein, low fat meals more frequently and exercising 30 mins a day 5 times a week with a goal of 10-15lb weight loss in the next 3 months. Patient voiced their understanding and motivation to adhere to these recommendations. ° °  °  ° Positive ANA (antinuclear antibody)  °  Noted on labs, followed by rheumatology at this time, continue this collaboration. °  °  °  ° °Follow up plan: °Return in about 6 months (around 09/08/2021) for HTN, ARTHRITIS, RA.  °

## 2021-03-11 NOTE — Assessment & Plan Note (Signed)
Chronic, ongoing -- is on birth control by GYN.  Would benefit discontinuation of this in upcoming months, discussed with her.  Discussed treatment options with patient.  Continue Gabapentin 300 MG QHS and adjust as needed, is benefiting from this.  Educated her on this medication and use + side effects.  Return to office in 6 months. Up to date on pap with GYN.

## 2021-03-11 NOTE — Assessment & Plan Note (Signed)
To left knee, hip, and lower back.  Suspect more OA, but had positive ANA.  Continue Meloxicam and collaboration with rheumatology at this time.  May take Tylenol as needed, up to 3000 MG daily + recommend use of Voltaren gel as needed.  Wear knee support when on feet long hours.  Consider PT referral or right knee steroid injection if ongoing or worsening.

## 2021-03-12 MED ORDER — WEGOVY 0.25 MG/0.5ML ~~LOC~~ SOAJ: 0.2500 mg | SUBCUTANEOUS | 2 refills | Status: DC

## 2021-03-12 NOTE — Addendum Note (Signed)
Addended by: Aura Dials T on: 03/12/2021 12:22 PM   Modules accepted: Orders

## 2021-03-13 ENCOUNTER — Telehealth: Payer: Self-pay

## 2021-03-13 NOTE — Telephone Encounter (Signed)
Prior authorization was initiated for [facility] prescription.  KEYJanine Hanson

## 2021-04-05 NOTE — Patient Instructions (Signed)
Healthy Eating ?Following a healthy eating pattern may help you to achieve and maintain a healthy body weight, reduce the risk of chronic disease, and live a long and productive life. It is important to follow a healthy eating pattern at an appropriate calorie level for your body. Your nutritional needs should be met primarily through food by choosing a variety of nutrient-rich foods. ?What are tips for following this plan? ?Reading food labels ?Read labels and choose the following: ?Reduced or low sodium. ?Juices with 100% fruit juice. ?Foods with low saturated fats and high polyunsaturated and monounsaturated fats. ?Foods with whole grains, such as whole wheat, cracked wheat, brown rice, and wild rice. ?Whole grains that are fortified with folic acid. This is recommended for women who are pregnant or who want to become pregnant. ?Read labels and avoid the following: ?Foods with a lot of added sugars. These include foods that contain brown sugar, corn sweetener, corn syrup, dextrose, fructose, glucose, high-fructose corn syrup, honey, invert sugar, lactose, malt syrup, maltose, molasses, raw sugar, sucrose, trehalose, or turbinado sugar. ?Do not eat more than the following amounts of added sugar per day: ?6 teaspoons (25 g) for women. ?9 teaspoons (38 g) for men. ?Foods that contain processed or refined starches and grains. ?Refined grain products, such as white flour, degermed cornmeal, white bread, and white rice. ?Shopping ?Choose nutrient-rich snacks, such as vegetables, whole fruits, and nuts. Avoid high-calorie and high-sugar snacks, such as potato chips, fruit snacks, and candy. ?Use oil-based dressings and spreads on foods instead of solid fats such as butter, stick margarine, or cream cheese. ?Limit pre-made sauces, mixes, and "instant" products such as flavored rice, instant noodles, and ready-made pasta. ?Try more plant-protein sources, such as tofu, tempeh, black beans, edamame, lentils, nuts, and  seeds. ?Explore eating plans such as the Mediterranean diet or vegetarian diet. ?Cooking ?Use oil to saut? or stir-fry foods instead of solid fats such as butter, stick margarine, or lard. ?Try baking, boiling, grilling, or broiling instead of frying. ?Remove the fatty part of meats before cooking. ?Steam vegetables in water or broth. ?Meal planning ? ?At meals, imagine dividing your plate into fourths: ?One-half of your plate is fruits and vegetables. ?One-fourth of your plate is whole grains. ?One-fourth of your plate is protein, especially lean meats, poultry, eggs, tofu, beans, or nuts. ?Include low-fat dairy as part of your daily diet. ?Lifestyle ?Choose healthy options in all settings, including home, work, school, restaurants, or stores. ?Prepare your food safely: ?Wash your hands after handling raw meats. ?Keep food preparation surfaces clean by regularly washing with hot, soapy water. ?Keep raw meats separate from ready-to-eat foods, such as fruits and vegetables. ?Cook seafood, meat, poultry, and eggs to the recommended internal temperature. ?Store foods at safe temperatures. In general: ?Keep cold foods at 40?F (4.4?C) or below. ?Keep hot foods at 140?F (60?C) or above. ?Keep your freezer at 0?F (-17.8?C) or below. ?Foods are no longer safe to eat when they have been between the temperatures of 40?-140?F (4.4-60?C) for more than 2 hours. ?What foods should I eat? ?Fruits ?Aim to eat 2 cup-equivalents of fresh, canned (in natural juice), or frozen fruits each day. Examples of 1 cup-equivalent of fruit include 1 small apple, 8 large strawberries, 1 cup canned fruit, ? cup dried fruit, or 1 cup 100% juice. ?Vegetables ?Aim to eat 2?-3 cup-equivalents of fresh and frozen vegetables each day, including different varieties and colors. Examples of 1 cup-equivalent of vegetables include 2 medium carrots, 2 cups raw,  leafy greens, 1 cup chopped vegetable (raw or cooked), or 1 medium baked potato. ?Grains ?Aim to  eat 6 ounce-equivalents of whole grains each day. Examples of 1 ounce-equivalent of grains include 1 slice of bread, 1 cup ready-to-eat cereal, 3 cups popcorn, or ? cup cooked rice, pasta, or cereal. ?Meats and other proteins ?Aim to eat 5-6 ounce-equivalents of protein each day. Examples of 1 ounce-equivalent of protein include 1 egg, 1/2 cup nuts or seeds, or 1 tablespoon (16 g) peanut butter. A cut of meat or fish that is the size of a deck of cards is about 3-4 ounce-equivalents. ?Of the protein you eat each week, try to have at least 8 ounces come from seafood. This includes salmon, trout, herring, and anchovies. ?Dairy ?Aim to eat 3 cup-equivalents of fat-free or low-fat dairy each day. Examples of 1 cup-equivalent of dairy include 1 cup (240 mL) milk, 8 ounces (250 g) yogurt, 1? ounces (44 g) natural cheese, or 1 cup (240 mL) fortified soy milk. ?Fats and oils ?Aim for about 5 teaspoons (21 g) per day. Choose monounsaturated fats, such as canola and olive oils, avocados, peanut butter, and most nuts, or polyunsaturated fats, such as sunflower, corn, and soybean oils, walnuts, pine nuts, sesame seeds, sunflower seeds, and flaxseed. ?Beverages ?Aim for six 8-oz glasses of water per day. Limit coffee to three to five 8-oz cups per day. ?Limit caffeinated beverages that have added calories, such as soda and energy drinks. ?Limit alcohol intake to no more than 1 drink a day for nonpregnant women and 2 drinks a day for men. One drink equals 12 oz of beer (355 mL), 5 oz of wine (148 mL), or 1? oz of hard liquor (44 mL). ?Seasoning and other foods ?Avoid adding excess amounts of salt to your foods. Try flavoring foods with herbs and spices instead of salt. ?Avoid adding sugar to foods. ?Try using oil-based dressings, sauces, and spreads instead of solid fats. ?This information is based on general [place] nutrition guidelines. For more information, visit Build[employer].es. Exact amounts may vary based on your nutrition  needs. ?Summary ?A healthy eating plan may help you to maintain a healthy weight, reduce the risk of chronic diseases, and stay active throughout your life. ?Plan your meals. Make sure you eat the right portions of a variety of nutrient-rich foods. ?Try baking, boiling, grilling, or broiling instead of frying. ?Choose healthy options in all settings, including home, work, school, restaurants, or stores. ?This information is not intended to replace advice given to you by your health care provider. Make sure you discuss any questions you have with your health care provider. ?Document Revised: 09/23/2020 Document Reviewed: 09/23/2020 ?Elsevier Patient Education ? [place]. ? ?

## 2021-04-08 ENCOUNTER — Other Ambulatory Visit: Payer: Self-pay

## 2021-04-08 ENCOUNTER — Encounter: Payer: Self-pay | Admitting: Nurse Practitioner

## 2021-04-08 ENCOUNTER — Ambulatory Visit (INDEPENDENT_AMBULATORY_CARE_PROVIDER_SITE_OTHER): Payer: BC Managed Care – PPO | Admitting: Nurse Practitioner

## 2021-04-08 DIAGNOSIS — Z6841 Body Mass Index (BMI) 40.0 and over, adult: Secondary | ICD-10-CM | POA: Diagnosis not present

## 2021-04-08 MED ORDER — WEGOVY 0.5 MG/0.5ML ~~LOC~~ SOAJ: 0.5000 mg | SUBCUTANEOUS | 1 refills | Status: DC

## 2021-04-08 NOTE — Assessment & Plan Note (Signed)
BMI 47.03, no loss at this time with [facility], but is tolerating and seeing appetite changes + increased energy levels.  Highly recommend weight loss for overall health.   Will increase Wegovy to next step, 0.5 MG weekly, discussed with patient at length.  Recommended eating smaller high protein, low fat meals more frequently and exercising 30 mins a day 5 times a week with a goal of 10-15lb weight loss in the next 3 months. Patient voiced their understanding and motivation to adhere to these recommendations. Return in 5 weeks. ? ?

## 2021-04-08 NOTE — Progress Notes (Signed)
? ?BP 113/82   Pulse 85   Temp 98.3 ?F (36.8 ?C) (Oral)   Ht 5\' 6"  (1.676 m)   Wt 291 lb 6.4 oz (132.2 kg)   SpO2 98%   BMI 47.03 kg/m?   ? ?Subjective:  ? ? Patient ID: Ruth Hanson, female    DOB: 20-Jan-1971, 51 y.o.   MRN: [id number] ? ?HPI: ?EMANUELLY Hanson is a 51 y.o. female ? ?Chief Complaint  ?Patient presents with  ? Weight Check  ?  Patient states she is doing well as well as she is doing well with the shot as she is losing weight.   ? ?OBESITY: ?She was started on Wegovy recent visit 0.25 MG, were able to get this approved by her insurance.  Currently tolerating this well without any side effects -- no nausea or GI issues.  She reports decrease in appetite with this -- when she did have a bigger meal this caused some stomach upset.  Is working on [employer] -- however does endorse cost of food is an issue.  Is working on reducing portions.  Attempting to work on exercise regimen -- working on adding this to her schedule.  She feels more energetic with medication on board. ? ?Relevant past medical, surgical, family and social history reviewed and updated as indicated. Interim medical history since our last visit reviewed. ?Allergies and medications reviewed and updated. ? ?Review of Systems  ?Constitutional:  Negative for activity change, appetite change, diaphoresis, fatigue and fever.  ?Respiratory:  Negative for cough, chest tightness and shortness of breath.   ?Cardiovascular:  Negative for chest pain, palpitations and leg swelling.  ?Gastrointestinal: Negative.   ?Endocrine: Negative.   ?Neurological: Negative.   ?Psychiatric/Behavioral: Negative.    ? ?Per HPI unless specifically indicated above ? ?   ?Objective:  ?  ?BP 113/82   Pulse 85   Temp 98.3 ?F (36.8 ?C) (Oral)   Ht 5\' 6"  (1.676 m)   Wt 291 lb 6.4 oz (132.2 kg)   SpO2 98%   BMI 47.03 kg/m?   ?Wt Readings from Last 3 Encounters:  ?04/08/21 291 lb 6.4 oz (132.2 kg)  ?03/11/21 291 lb 9.6 oz (132.3 kg)  ?02/03/21 296 lb 9.7  oz (134.5 kg)  ?  ?Physical Exam ?Vitals and nursing note reviewed.  ?Constitutional:   ?   General: She is awake. She is not in acute distress. ?   Appearance: She is well-developed and well-groomed. She is obese. She is not ill-appearing or toxic-appearing.  ?HENT:  ?   Head: Normocephalic.  ?   Right Ear: Hearing normal.  ?   Left Ear: Hearing normal.  ?Eyes:  ?   General: Lids are normal.     ?   Right eye: No discharge.     ?   Left eye: No discharge.  ?   Conjunctiva/sclera: Conjunctivae normal.  ?   Pupils: Pupils are equal, round, and reactive to light.  ?Neck:  ?   Thyroid: No thyromegaly.  ?   Vascular: No carotid bruit.  ?Cardiovascular:  ?   Rate and Rhythm: Normal rate and regular rhythm.  ?   Heart sounds: Normal heart sounds. No murmur heard. ?  No gallop.  ?Pulmonary:  ?   Effort: Pulmonary effort is normal. No accessory muscle usage or respiratory distress.  ?   Breath sounds: Normal breath sounds.  ?Abdominal:  ?   General: Bowel sounds are normal. There is no distension.  ?  Palpations: Abdomen is soft.  ?   Tenderness: There is no abdominal tenderness.  ?   Hernia: No hernia is present.  ?Musculoskeletal:  ?   Cervical back: Normal range of motion and neck supple.  ?   Lumbar back: Normal.  ?   Right lower leg: 1+ Edema present.  ?   Left lower leg: 1+ Edema present.  ?Lymphadenopathy:  ?   Cervical: No cervical adenopathy.  ?Skin: ?   General: Skin is warm and dry.  ?Neurological:  ?   Mental Status: She is alert and oriented to person, place, and time.  ?Psychiatric:     ?   Attention and Perception: Attention normal.     ?   Mood and Affect: Mood normal.     ?   Behavior: Behavior normal. Behavior is cooperative.     ?   Thought Content: Thought content normal.     ?   Judgment: Judgment normal.  ? ? ?Results for orders placed or performed during the hospital encounter of 02/03/21  ?Pregnancy, urine POC  ?Result Value Ref Range  ? Preg Test, Ur NEGATIVE NEGATIVE  ? ?   ?Assessment & Plan:   ? ?Problem List Items Addressed This Visit   ? ?  ? Other  ? Obesity  ?  BMI 47.03, no loss at this time with [facility], but is tolerating and seeing appetite changes + increased energy levels.  Highly recommend weight loss for overall health.   Will increase Wegovy to next step, 0.5 MG weekly, discussed with patient at length.  Recommended eating smaller high protein, low fat meals more frequently and exercising 30 mins a day 5 times a week with a goal of 10-15lb weight loss in the next 3 months. Patient voiced their understanding and motivation to adhere to these recommendations. Return in 5 weeks. ? ?  ?  ? Relevant Medications  ? Semaglutide-Weight Management (WEGOVY) 0.5 MG/0.5ML SOAJ  ?  ? ?Follow up plan: ?Return in about 5 weeks (around 05/13/2021) for [place]. ? ? ? ? ? ?

## 2021-05-09 NOTE — Patient Instructions (Incomplete)
Healthy Eating ?Following a healthy eating pattern may help you to achieve and maintain a healthy body weight, reduce the risk of chronic disease, and live a long and productive life. It is important to follow a healthy eating pattern at an appropriate calorie level for your body. Your nutritional needs should be met primarily through food by choosing a variety of nutrient-rich foods. ?What are tips for following this plan? ?Reading food labels ?Read labels and choose the following: ?Reduced or low sodium. ?Juices with 100% fruit juice. ?Foods with low saturated fats and high polyunsaturated and monounsaturated fats. ?Foods with whole grains, such as whole wheat, cracked wheat, brown rice, and wild rice. ?Whole grains that are fortified with folic acid. This is recommended for women who are pregnant or who want to become pregnant. ?Read labels and avoid the following: ?Foods with a lot of added sugars. These include foods that contain brown sugar, corn sweetener, corn syrup, dextrose, fructose, glucose, high-fructose corn syrup, honey, invert sugar, lactose, malt syrup, maltose, molasses, raw sugar, sucrose, trehalose, or turbinado sugar. ?Do not eat more than the following amounts of added sugar per day: ?6 teaspoons (25 g) for women. ?9 teaspoons (38 g) for men. ?Foods that contain processed or refined starches and grains. ?Refined grain products, such as white flour, degermed cornmeal, white bread, and white rice. ?Shopping ?Choose nutrient-rich snacks, such as vegetables, whole fruits, and nuts. Avoid high-calorie and high-sugar snacks, such as potato chips, fruit snacks, and candy. ?Use oil-based dressings and spreads on foods instead of solid fats such as butter, stick margarine, or cream cheese. ?Limit pre-made sauces, mixes, and "instant" products such as flavored rice, instant noodles, and ready-made pasta. ?Try more plant-protein sources, such as tofu, tempeh, black beans, edamame, lentils, nuts, and  seeds. ?Explore eating plans such as the Mediterranean diet or vegetarian diet. ?Cooking ?Use oil to saut? or stir-fry foods instead of solid fats such as butter, stick margarine, or lard. ?Try baking, boiling, grilling, or broiling instead of frying. ?Remove the fatty part of meats before cooking. ?Steam vegetables in water or broth. ?Meal planning ? ?At meals, imagine dividing your plate into fourths: ?One-half of your plate is fruits and vegetables. ?One-fourth of your plate is whole grains. ?One-fourth of your plate is protein, especially lean meats, poultry, eggs, tofu, beans, or nuts. ?Include low-fat dairy as part of your daily diet. ?Lifestyle ?Choose healthy options in all settings, including home, work, school, restaurants, or stores. ?Prepare your food safely: ?Wash your hands after handling raw meats. ?Keep food preparation surfaces clean by regularly washing with hot, soapy water. ?Keep raw meats separate from ready-to-eat foods, such as fruits and vegetables. ?Cook seafood, meat, poultry, and eggs to the recommended internal temperature. ?Store foods at safe temperatures. In general: ?Keep cold foods at 40?F (4.4?C) or below. ?Keep hot foods at 140?F (60?C) or above. ?Keep your freezer at 0?F (-17.8?C) or below. ?Foods are no longer safe to eat when they have been between the temperatures of 40?-140?F (4.4-60?C) for more than 2 hours. ?What foods should I eat? ?Fruits ?Aim to eat 2 cup-equivalents of fresh, canned (in natural juice), or frozen fruits each day. Examples of 1 cup-equivalent of fruit include 1 small apple, 8 large strawberries, 1 cup canned fruit, ? cup dried fruit, or 1 cup 100% juice. ?Vegetables ?Aim to eat 2?-3 cup-equivalents of fresh and frozen vegetables each day, including different varieties and colors. Examples of 1 cup-equivalent of vegetables include 2 medium carrots, 2 cups raw,  leafy greens, 1 cup chopped vegetable (raw or cooked), or 1 medium baked potato. ?Grains ?Aim to  eat 6 ounce-equivalents of whole grains each day. Examples of 1 ounce-equivalent of grains include 1 slice of bread, 1 cup ready-to-eat cereal, 3 cups popcorn, or ? cup cooked rice, pasta, or cereal. ?Meats and other proteins ?Aim to eat 5-6 ounce-equivalents of protein each day. Examples of 1 ounce-equivalent of protein include 1 egg, 1/2 cup nuts or seeds, or 1 tablespoon (16 g) peanut butter. A cut of meat or fish that is the size of a deck of cards is about 3-4 ounce-equivalents. ?Of the protein you eat each week, try to have at least 8 ounces come from seafood. This includes salmon, trout, herring, and anchovies. ?Dairy ?Aim to eat 3 cup-equivalents of fat-free or low-fat dairy each day. Examples of 1 cup-equivalent of dairy include 1 cup (240 mL) milk, 8 ounces (250 g) yogurt, 1? ounces (44 g) natural cheese, or 1 cup (240 mL) fortified soy milk. ?Fats and oils ?Aim for about 5 teaspoons (21 g) per day. Choose monounsaturated fats, such as canola and olive oils, avocados, peanut butter, and most nuts, or polyunsaturated fats, such as sunflower, corn, and soybean oils, walnuts, pine nuts, sesame seeds, sunflower seeds, and flaxseed. ?Beverages ?Aim for six 8-oz glasses of water per day. Limit coffee to three to five 8-oz cups per day. ?Limit caffeinated beverages that have added calories, such as soda and energy drinks. ?Limit alcohol intake to no more than 1 drink a day for nonpregnant women and 2 drinks a day for men. One drink equals 12 oz of beer (355 mL), 5 oz of wine (148 mL), or 1? oz of hard liquor (44 mL). ?Seasoning and other foods ?Avoid adding excess amounts of salt to your foods. Try flavoring foods with herbs and spices instead of salt. ?Avoid adding sugar to foods. ?Try using oil-based dressings, sauces, and spreads instead of solid fats. ?This information is based on general [place] nutrition guidelines. For more information, visit Build[employer].es. Exact amounts may vary based on your nutrition  needs. ?Summary ?A healthy eating plan may help you to maintain a healthy weight, reduce the risk of chronic diseases, and stay active throughout your life. ?Plan your meals. Make sure you eat the right portions of a variety of nutrient-rich foods. ?Try baking, boiling, grilling, or broiling instead of frying. ?Choose healthy options in all settings, including home, work, school, restaurants, or stores. ?This information is not intended to replace advice given to you by your health care provider. Make sure you discuss any questions you have with your health care provider. ?Document Revised: 09/23/2020 Document Reviewed: 09/23/2020 ?Elsevier Patient Education ? [place]. ? ?

## 2021-05-13 ENCOUNTER — Encounter: Payer: Self-pay | Admitting: Nurse Practitioner

## 2021-05-13 ENCOUNTER — Ambulatory Visit: Payer: BC Managed Care – PPO | Admitting: Nurse Practitioner

## 2021-05-16 NOTE — Patient Instructions (Signed)
Healthy Eating ?Following a healthy eating pattern may help you to achieve and maintain a healthy body weight, reduce the risk of chronic disease, and live a long and productive life. It is important to follow a healthy eating pattern at an appropriate calorie level for your body. Your nutritional needs should be met primarily through food by choosing a variety of nutrient-rich foods. ?What are tips for following this plan? ?Reading food labels ?Read labels and choose the following: ?Reduced or low sodium. ?Juices with 100% fruit juice. ?Foods with low saturated fats and high polyunsaturated and monounsaturated fats. ?Foods with whole grains, such as whole wheat, cracked wheat, brown rice, and wild rice. ?Whole grains that are fortified with folic acid. This is recommended for women who are pregnant or who want to become pregnant. ?Read labels and avoid the following: ?Foods with a lot of added sugars. These include foods that contain brown sugar, corn sweetener, corn syrup, dextrose, fructose, glucose, high-fructose corn syrup, honey, invert sugar, lactose, malt syrup, maltose, molasses, raw sugar, sucrose, trehalose, or turbinado sugar. ?Do not eat more than the following amounts of added sugar per day: ?6 teaspoons (25 g) for women. ?9 teaspoons (38 g) for men. ?Foods that contain processed or refined starches and grains. ?Refined grain products, such as white flour, degermed cornmeal, white bread, and white rice. ?Shopping ?Choose nutrient-rich snacks, such as vegetables, whole fruits, and nuts. Avoid high-calorie and high-sugar snacks, such as potato chips, fruit snacks, and candy. ?Use oil-based dressings and spreads on foods instead of solid fats such as butter, stick margarine, or cream cheese. ?Limit pre-made sauces, mixes, and "instant" products such as flavored rice, instant noodles, and ready-made pasta. ?Try more plant-protein sources, such as tofu, tempeh, black beans, edamame, lentils, nuts, and  seeds. ?Explore eating plans such as the Mediterranean diet or vegetarian diet. ?Cooking ?Use oil to saut? or stir-fry foods instead of solid fats such as butter, stick margarine, or lard. ?Try baking, boiling, grilling, or broiling instead of frying. ?Remove the fatty part of meats before cooking. ?Steam vegetables in water or broth. ?Meal planning ? ?At meals, imagine dividing your plate into fourths: ?One-half of your plate is fruits and vegetables. ?One-fourth of your plate is whole grains. ?One-fourth of your plate is protein, especially lean meats, poultry, eggs, tofu, beans, or nuts. ?Include low-fat dairy as part of your daily diet. ?Lifestyle ?Choose healthy options in all settings, including home, work, school, restaurants, or stores. ?Prepare your food safely: ?Wash your hands after handling raw meats. ?Keep food preparation surfaces clean by regularly washing with hot, soapy water. ?Keep raw meats separate from ready-to-eat foods, such as fruits and vegetables. ?Cook seafood, meat, poultry, and eggs to the recommended internal temperature. ?Store foods at safe temperatures. In general: ?Keep cold foods at 40?F (4.4?C) or below. ?Keep hot foods at 140?F (60?C) or above. ?Keep your freezer at 0?F (-17.8?C) or below. ?Foods are no longer safe to eat when they have been between the temperatures of 40?-140?F (4.4-60?C) for more than 2 hours. ?What foods should I eat? ?Fruits ?Aim to eat 2 cup-equivalents of fresh, canned (in natural juice), or frozen fruits each day. Examples of 1 cup-equivalent of fruit include 1 small apple, 8 large strawberries, 1 cup canned fruit, ? cup dried fruit, or 1 cup 100% juice. ?Vegetables ?Aim to eat 2?-3 cup-equivalents of fresh and frozen vegetables each day, including different varieties and colors. Examples of 1 cup-equivalent of vegetables include 2 medium carrots, 2 cups raw,  leafy greens, 1 cup chopped vegetable (raw or cooked), or 1 medium baked potato. ?Grains ?Aim to  eat 6 ounce-equivalents of whole grains each day. Examples of 1 ounce-equivalent of grains include 1 slice of bread, 1 cup ready-to-eat cereal, 3 cups popcorn, or ? cup cooked rice, pasta, or cereal. ?Meats and other proteins ?Aim to eat 5-6 ounce-equivalents of protein each day. Examples of 1 ounce-equivalent of protein include 1 egg, 1/2 cup nuts or seeds, or 1 tablespoon (16 g) peanut butter. A cut of meat or fish that is the size of a deck of cards is about 3-4 ounce-equivalents. ?Of the protein you eat each week, try to have at least 8 ounces come from seafood. This includes salmon, trout, herring, and anchovies. ?Dairy ?Aim to eat 3 cup-equivalents of fat-free or low-fat dairy each day. Examples of 1 cup-equivalent of dairy include 1 cup (240 mL) milk, 8 ounces (250 g) yogurt, 1? ounces (44 g) natural cheese, or 1 cup (240 mL) fortified soy milk. ?Fats and oils ?Aim for about 5 teaspoons (21 g) per day. Choose monounsaturated fats, such as canola and olive oils, avocados, peanut butter, and most nuts, or polyunsaturated fats, such as sunflower, corn, and soybean oils, walnuts, pine nuts, sesame seeds, sunflower seeds, and flaxseed. ?Beverages ?Aim for six 8-oz glasses of water per day. Limit coffee to three to five 8-oz cups per day. ?Limit caffeinated beverages that have added calories, such as soda and energy drinks. ?Limit alcohol intake to no more than 1 drink a day for nonpregnant women and 2 drinks a day for men. One drink equals 12 oz of beer (355 mL), 5 oz of wine (148 mL), or 1? oz of hard liquor (44 mL). ?Seasoning and other foods ?Avoid adding excess amounts of salt to your foods. Try flavoring foods with herbs and spices instead of salt. ?Avoid adding sugar to foods. ?Try using oil-based dressings, sauces, and spreads instead of solid fats. ?This information is based on general [place] nutrition guidelines. For more information, visit Build[employer].es. Exact amounts may vary based on your nutrition  needs. ?Summary ?A healthy eating plan may help you to maintain a healthy weight, reduce the risk of chronic diseases, and stay active throughout your life. ?Plan your meals. Make sure you eat the right portions of a variety of nutrient-rich foods. ?Try baking, boiling, grilling, or broiling instead of frying. ?Choose healthy options in all settings, including home, work, school, restaurants, or stores. ?This information is not intended to replace advice given to you by your health care provider. Make sure you discuss any questions you have with your health care provider. ?Document Revised: 09/23/2020 Document Reviewed: 09/23/2020 ?Elsevier Patient Education ? [place]. ? ?

## 2021-05-19 ENCOUNTER — Ambulatory Visit: Payer: BC Managed Care – PPO | Admitting: Nurse Practitioner

## 2021-05-19 ENCOUNTER — Encounter: Payer: Self-pay | Admitting: Nurse Practitioner

## 2021-05-19 DIAGNOSIS — Z6841 Body Mass Index (BMI) 40.0 and over, adult: Secondary | ICD-10-CM | POA: Diagnosis not present

## 2021-05-19 DIAGNOSIS — E66813 Obesity, class 3: Secondary | ICD-10-CM

## 2021-05-19 MED ORDER — WEGOVY 1 MG/0.5ML ~~LOC~~ SOAJ: 1.0000 mg | SUBCUTANEOUS | 2 refills | Status: DC

## 2021-05-19 NOTE — Progress Notes (Signed)
? ?BP 127/80   Pulse 85   Temp 98.2 ?F (36.8 ?C) (Oral)   Ht 5\' 6"  (1.676 m)   Wt 287 lb 9.6 oz (130.5 kg)   SpO2 99%   BMI 46.42 kg/m?   ? ?Subjective:  ? ? Patient ID: , female    DOB: [birth year], 51 y.o.   MRN: [id number] ? ?HPI: ?Ruth Hanson is a 51 y.o. female ? ?Chief Complaint  ?Patient presents with  ? Weight Check  ?  Patient is here for follow up on Wegovy and Weight Check. Patient states she is doing well. Patient states she missed her dose on Sunday, due to being out of her medication.   ? ?OBESITY: ?She was started on Wegovy on 03/11/21, currently on 0.5 MG dosing.  Has lost total of 4 pounds at this time, from 291 lbs to 287 lbs.  Missed one dose this Sunday.  Currently tolerating this well without any side effects -- no nausea or GI issues.  She reports decrease in appetite with this and can not eat larger meals.  Is working on Thursday -- however does endorse cost of food is an issue + recently had lots of stressors which changed diet a bit.  Is working on reducing portions.  Attempting to work on exercise regimen -- is going to add this on in future once settled into new home.  She feels more energetic with medication on board. ? ?Relevant past medical, surgical, family and social history reviewed and updated as indicated. Interim medical history since our last visit reviewed. ?Allergies and medications reviewed and updated. ? ?Review of Systems  ?Constitutional:  Negative for activity change, appetite change, diaphoresis, fatigue and fever.  ?Respiratory:  Negative for cough, chest tightness and shortness of breath.   ?Cardiovascular:  Negative for chest pain, palpitations and leg swelling.  ?Gastrointestinal: Negative.   ?Endocrine: Negative.   ?Neurological: Negative.   ?Psychiatric/Behavioral: Negative.    ? ?Per HPI unless specifically indicated above ? ?   ?Objective:  ?  ?BP 127/80   Pulse 85   Temp 98.2 ?F (36.8 ?C) (Oral)   Ht 5\' 6"  (1.676 m)   Wt 287 lb  9.6 oz (130.5 kg)   SpO2 99%   BMI 46.42 kg/m?   ?Wt Readings from Last 3 Encounters:  ?05/19/21 287 lb 9.6 oz (130.5 kg)  ?04/08/21 291 lb 6.4 oz (132.2 kg)  ?03/11/21 291 lb 9.6 oz (132.3 kg)  ?  ?Physical Exam ?Vitals and nursing note reviewed.  ?Constitutional:   ?   General: She is awake. She is not in acute distress. ?   Appearance: She is well-developed and well-groomed. She is obese. She is not ill-appearing or toxic-appearing.  ?HENT:  ?   Head: Normocephalic.  ?   Right Ear: Hearing normal.  ?   Left Ear: Hearing normal.  ?Eyes:  ?   General: Lids are normal.     ?   Right eye: No discharge.     ?   Left eye: No discharge.  ?   Conjunctiva/sclera: Conjunctivae normal.  ?   Pupils: Pupils are equal, round, and reactive to light.  ?Neck:  ?   Thyroid: No thyromegaly.  ?   Vascular: No carotid bruit.  ?Cardiovascular:  ?   Rate and Rhythm: Normal rate and regular rhythm.  ?   Heart sounds: Normal heart sounds. No murmur heard. ?  No gallop.  ?Pulmonary:  ?   Effort: Pulmonary  effort is normal. No accessory muscle usage or respiratory distress.  ?   Breath sounds: Normal breath sounds.  ?Abdominal:  ?   General: Bowel sounds are normal. There is no distension.  ?   Palpations: Abdomen is soft.  ?   Tenderness: There is no abdominal tenderness.  ?   Hernia: No hernia is present.  ?Musculoskeletal:  ?   Cervical back: Normal range of motion and neck supple.  ?   Lumbar back: Normal.  ?   Right lower leg: 1+ Edema present.  ?   Left lower leg: 1+ Edema present.  ?Lymphadenopathy:  ?   Cervical: No cervical adenopathy.  ?Skin: ?   General: Skin is warm and dry.  ?Neurological:  ?   Mental Status: She is alert and oriented to person, place, and time.  ?Psychiatric:     ?   Attention and Perception: Attention normal.     ?   Mood and Affect: Mood normal.     ?   Behavior: Behavior normal. Behavior is cooperative.     ?   Thought Content: Thought content normal.     ?   Judgment: Judgment normal.  ? ? ?Results for  orders placed or performed during the hospital encounter of 02/03/21  ?Pregnancy, urine POC  ?Result Value Ref Range  ? Preg Test, Ur NEGATIVE NEGATIVE  ? ?   ?Assessment & Plan:  ? ?Problem List Items Addressed This Visit   ? ?  ? Other  ? Obesity  ?  BMI 46.42, 4 lbs loss total at this time with Wegovy (started 03/11/21), is tolerating and seeing appetite changes + increased energy levels.  Highly recommend weight loss for overall health.  Will increase Wegovy to next step, 1 MG weekly, discussed with patient at length.  Recommended eating smaller high protein, low fat meals more frequently and exercising 30 mins a day 5 times a week with a goal of 10-15lb weight loss in the next 3 months. Patient voiced their understanding and motivation to adhere to these recommendations. Return in 6 weeks. ? ?  ?  ? Relevant Medications  ? Semaglutide-Weight Management (WEGOVY) 1 MG/0.5ML SOAJ  ?  ? ?Follow up plan: ?Return in about 6 weeks (around 06/30/2021) for Weight check and ARTHRITIS CHECK, HTN, VIT D. ? ? ? ? ? ?

## 2021-05-19 NOTE — Assessment & Plan Note (Signed)
BMI 46.42, 4 lbs loss total at this time with Wegovy (started 03/11/21), is tolerating and seeing appetite changes + increased energy levels.  Highly recommend weight loss for overall health.  Will increase Wegovy to next step, 1 MG weekly, discussed with patient at length.  Recommended eating smaller high protein, low fat meals more frequently and exercising 30 mins a day 5 times a week with a goal of 10-15lb weight loss in the next 3 months. Patient voiced their understanding and motivation to adhere to these recommendations. Return in 6 weeks. ? ?

## 2021-06-28 NOTE — Patient Instructions (Incomplete)
Recommend starting Xyzal or Claritin daily for at least the next 30 days + try Flonase Sensimist which is less difficult to handle.  Healthy Eating Following a healthy eating pattern may help you to achieve and maintain a healthy body weight, reduce the risk of chronic disease, and live a long and productive life. It is important to follow a healthy eating pattern at an appropriate calorie level for your body. Your nutritional needs should be met primarily through food by choosing a variety of nutrient-rich foods. What are tips for following this plan? Reading food labels Read labels and choose the following: Reduced or low sodium. Juices with 100% fruit juice. Foods with low saturated fats and high polyunsaturated and monounsaturated fats. Foods with whole grains, such as whole wheat, cracked wheat, brown rice, and wild rice. Whole grains that are fortified with folic acid. This is recommended for women who are pregnant or who want to become pregnant. Read labels and avoid the following: Foods with a lot of added sugars. These include foods that contain brown sugar, corn sweetener, corn syrup, dextrose, fructose, glucose, high-fructose corn syrup, honey, invert sugar, lactose, malt syrup, maltose, molasses, raw sugar, sucrose, trehalose, or turbinado sugar. Do not eat more than the following amounts of added sugar per day: 6 teaspoons (25 g) for women. 9 teaspoons (38 g) for men. Foods that contain processed or refined starches and grains. Refined grain products, such as white flour, degermed cornmeal, white bread, and white rice. Shopping Choose nutrient-rich snacks, such as vegetables, whole fruits, and nuts. Avoid high-calorie and high-sugar snacks, such as potato chips, fruit snacks, and candy. Use oil-based dressings and spreads on foods instead of solid fats such as butter, stick margarine, or cream cheese. Limit pre-made sauces, mixes, and "instant" products such as flavored rice,  instant noodles, and ready-made pasta. Try more plant-protein sources, such as tofu, tempeh, black beans, edamame, lentils, nuts, and seeds. Explore eating plans such as the Mediterranean diet or vegetarian diet. Cooking Use oil to saut or stir-fry foods instead of solid fats such as butter, stick margarine, or lard. Try baking, boiling, grilling, or broiling instead of frying. Remove the fatty part of meats before cooking. Steam vegetables in water or broth. Meal planning  At meals, imagine dividing your plate into fourths: One-half of your plate is fruits and vegetables. One-fourth of your plate is whole grains. One-fourth of your plate is protein, especially lean meats, poultry, eggs, tofu, beans, or nuts. Include low-fat dairy as part of your daily diet. Lifestyle Choose healthy options in all settings, including home, work, school, restaurants, or stores. Prepare your food safely: Wash your hands after handling raw meats. Keep food preparation surfaces clean by regularly washing with hot, soapy water. Keep raw meats separate from ready-to-eat foods, such as fruits and vegetables. Cook seafood, meat, poultry, and eggs to the recommended internal temperature. Store foods at safe temperatures. In general: Keep cold foods at 60F (4.4C) or below. Keep hot foods at 160F (60C) or above. Keep your freezer at [facility] (-17.8C) or below. Foods are no longer safe to eat when they have been between the temperatures of 40-160F (4.4-60C) for more than 2 hours. What foods should I eat? Fruits Aim to eat 2 cup-equivalents of fresh, canned (in natural juice), or frozen fruits each day. Examples of 1 cup-equivalent of fruit include 1 small apple, 8 large strawberries, 1 cup canned fruit,  cup dried fruit, or 1 cup 100% juice. Vegetables Aim to eat 2-3 cup-equivalents of  fresh and frozen vegetables each day, including different varieties and colors. Examples of 1 cup-equivalent of vegetables  include 2 medium carrots, 2 cups raw, leafy greens, 1 cup chopped vegetable (raw or cooked), or 1 medium baked potato. Grains Aim to eat 6 ounce-equivalents of whole grains each day. Examples of 1 ounce-equivalent of grains include 1 slice of bread, 1 cup ready-to-eat cereal, 3 cups popcorn, or  cup cooked rice, pasta, or cereal. Meats and other proteins Aim to eat 5-6 ounce-equivalents of protein each day. Examples of 1 ounce-equivalent of protein include 1 egg, 1/2 cup nuts or seeds, or 1 tablespoon (16 g) peanut butter. A cut of meat or fish that is the size of a deck of cards is about 3-4 ounce-equivalents. Of the protein you eat each week, try to have at least 8 ounces come from seafood. This includes salmon, trout, herring, and anchovies. Dairy Aim to eat 3 cup-equivalents of fat-free or low-fat dairy each day. Examples of 1 cup-equivalent of dairy include 1 cup (240 mL) milk, 8 ounces (250 g) yogurt, 1 ounces (44 g) natural cheese, or 1 cup (240 mL) fortified soy milk. Fats and oils Aim for about 5 teaspoons (21 g) per day. Choose monounsaturated fats, such as canola and olive oils, avocados, peanut butter, and most nuts, or polyunsaturated fats, such as sunflower, corn, and soybean oils, walnuts, pine nuts, sesame seeds, sunflower seeds, and flaxseed. Beverages Aim for six 8-oz glasses of water per day. Limit coffee to three to five 8-oz cups per day. Limit caffeinated beverages that have added calories, such as soda and energy drinks. Limit alcohol intake to no more than 1 drink a day for nonpregnant women and 2 drinks a day for men. One drink equals 12 oz of beer (355 mL), 5 oz of wine (148 mL), or 1 oz of hard liquor (44 mL). Seasoning and other foods Avoid adding excess amounts of salt to your foods. Try flavoring foods with herbs and spices instead of salt. Avoid adding sugar to foods. Try using oil-based dressings, sauces, and spreads instead of solid fats. This information is  based on general [place] nutrition guidelines. For more information, visit [employer]. Exact amounts may vary based on your nutrition needs. Summary A healthy eating plan may help you to maintain a healthy weight, reduce the risk of chronic diseases, and stay active throughout your life. Plan your meals. Make sure you eat the right portions of a variety of nutrient-rich foods. Try baking, boiling, grilling, or broiling instead of frying. Choose healthy options in all settings, including home, work, school, restaurants, or stores. This information is not intended to replace advice given to you by your health care provider. Make sure you discuss any questions you have with your health care provider. Document Revised: 09/23/2020 Document Reviewed: 09/23/2020 Elsevier Patient Education  2023 [employer].

## 2021-06-30 ENCOUNTER — Encounter: Payer: Self-pay | Admitting: Nurse Practitioner

## 2021-06-30 ENCOUNTER — Ambulatory Visit: Payer: BC Managed Care – PPO | Admitting: Nurse Practitioner

## 2021-06-30 VITALS — BP 124/78 | HR 79 | Temp 98.7°F | Ht 66.0 in | Wt 285.8 lb

## 2021-06-30 DIAGNOSIS — Z6841 Body Mass Index (BMI) 40.0 and over, adult: Secondary | ICD-10-CM

## 2021-06-30 DIAGNOSIS — R051 Acute cough: Secondary | ICD-10-CM | POA: Diagnosis not present

## 2021-06-30 DIAGNOSIS — N951 Menopausal and female climacteric states: Secondary | ICD-10-CM

## 2021-06-30 DIAGNOSIS — R059 Cough, unspecified: Secondary | ICD-10-CM | POA: Insufficient documentation

## 2021-06-30 MED ORDER — BENZONATATE 100 MG PO CAPS: 100.0000 mg | ORAL_CAPSULE | Freq: Three times a day (TID) | ORAL | 0 refills | Status: DC | PRN

## 2021-06-30 MED ORDER — WEGOVY 1.7 MG/0.75ML ~~LOC~~ SOAJ
1.7000 mg | SUBCUTANEOUS | 4 refills | Status: DC
Start: 2021-06-30 — End: 2021-08-04

## 2021-06-30 NOTE — Assessment & Plan Note (Signed)
Chronic, ongoing -- has stopped birth control.  Would benefit discontinuation of this in upcoming months, discussed with her.  Discussed treatment options with patient.  Continue Gabapentin 300 MG QHS and adjust as needed, is benefiting from this.  Up to date on pap with GYN.  Educated her on return of cycles post birth control cessation.

## 2021-06-30 NOTE — Progress Notes (Signed)
BP 124/78   Pulse 79   Temp 98.7 F (37.1 C) (Oral)   Ht 5\' 6"  (1.676 m)   Wt 285 lb 12.8 oz (129.6 kg)   SpO2 99%   BMI 46.13 kg/m    Subjective:    Patient ID: , female    DOB: 1970-06-02, 51 y.o.   MRN: [id number]  HPI: Ruth Hanson is a 51 y.o. female  Chief Complaint  Patient presents with   Weight Check    Patient is here for a six week follow up on Weight Check. Patient states she is stressing a little bit and thinks she may have picked up some weight due to the stress-related.    Cough    Patient states she would like to discuss with provider a medication to help with her current cough. Patient states she has had the cough for about 4 days. Patient states she has tried over the counter Robitussin and it does not help. Patient denies having any phlegm with the cough.    Medication Refill    Patient is requesting a refill on Wegovy prescription at today's visit.    Menstrual Problem    Patient states she is having the symptoms of having her cycle but not bleeding. Patient states she has stopped [facility] prescription and would like to discuss.    OBESITY: She was started on Wegovy on 03/11/21, currently on 1 MG dosing.  Has lost total of 6 pounds at this time, from 291 lbs to 285 lbs -- was able to fit into pants she could not before.  Has been out of [facility] for 2-3 weeks at this time, did not alert provider.  Does endorse increased stressors recently -- trying to move into new home.  Currently tolerating this well without any side effects -- no nausea or GI issues. Reports ongoing decrease in appetite with this and can not eat larger meals.  Is working on [facility] -- does endorse cost of food is an issue + recently had lots of stressors which changed diet a bit.  Is working on reducing portions.  Continues to work on exercise regimen -- is going to add this on in future once settled into new home.  Feels more energetic with medication on board.  She  recently came off [place], about 4-5 weeks ago.  Reports she feels like cycle may come on, but this does not come on.  Has had some bloating and back pain.    COUGH Has had for 4 days, taking Robitussin + honey/lemon tea.  Did take Allegra without benefit for 2 days.  Notices the cough more at night when gets too hot.   Duration: days Circumstances of initial development of cough: unknown Cough severity: mild Cough description: non-productive, dry, and irritating Aggravating factors:  worse at night + is clearing throat a lot Alleviating factors: nothing Status:  fluctuating Treatments attempted: Robitussin, Allegra Wheezing: no Shortness of breath: no Chest pain: no Chest tightness:no Nasal congestion: no Runny nose: no Postnasal drip: no Frequent throat clearing or swallowing: yes Hemoptysis: no Fevers: no Night sweats: no Weight loss: no Heartburn: no Recent foreign travel: no Tuberculosis contacts: no     Relevant past medical, surgical, family and social history reviewed and updated as indicated. Interim medical history since our last visit reviewed. Allergies and medications reviewed and updated.  Review of Systems  Constitutional:  Negative for activity change, appetite change, diaphoresis, fatigue and fever.  HENT: Negative.  Respiratory:  Positive for cough. Negative for chest tightness, shortness of breath and wheezing.   Cardiovascular:  Negative for chest pain, palpitations and leg swelling.  Gastrointestinal: Negative.   Neurological: Negative.   Psychiatric/Behavioral: Negative.     Per HPI unless specifically indicated above     Objective:    BP 124/78   Pulse 79   Temp 98.7 F (37.1 C) (Oral)   Ht 5\' 6"  (1.676 m)   Wt 285 lb 12.8 oz (129.6 kg)   SpO2 99%   BMI 46.13 kg/m   Wt Readings from Last 3 Encounters:  06/30/21 285 lb 12.8 oz (129.6 kg)  05/19/21 287 lb 9.6 oz (130.5 kg)  04/08/21 291 lb 6.4 oz (132.2 kg)    Physical Exam Vitals and  nursing note reviewed.  Constitutional:      General: She is awake. She is not in acute distress.    Appearance: She is well-developed and well-groomed. She is obese. She is not ill-appearing or toxic-appearing.  HENT:     Head: Normocephalic.     Right Ear: Hearing, tympanic membrane, ear canal and external ear normal.     Left Ear: Hearing, tympanic membrane, ear canal and external ear normal.     Nose: Nose normal.     Right Sinus: No maxillary sinus tenderness or frontal sinus tenderness.     Left Sinus: No maxillary sinus tenderness or frontal sinus tenderness.     Mouth/Throat:     Mouth: Mucous membranes are moist.     Pharynx: Posterior oropharyngeal erythema (mild with cobblestone appearance) present. No pharyngeal swelling or oropharyngeal exudate.  Eyes:     General: Lids are normal.        Right eye: No discharge.        Left eye: No discharge.     Conjunctiva/sclera: Conjunctivae normal.     Pupils: Pupils are equal, round, and reactive to light.  Neck:     Thyroid: No thyromegaly.     Vascular: No carotid bruit.  Cardiovascular:     Rate and Rhythm: Normal rate and regular rhythm.     Heart sounds: Normal heart sounds. No murmur heard.   No gallop.  Pulmonary:     Effort: Pulmonary effort is normal. No accessory muscle usage or respiratory distress.     Breath sounds: Normal breath sounds.     Comments: Frequently clearing throat today. Abdominal:     General: Bowel sounds are normal. There is no distension.     Palpations: Abdomen is soft.     Tenderness: There is no abdominal tenderness.     Hernia: No hernia is present.  Musculoskeletal:     Cervical back: Normal range of motion and neck supple.     Lumbar back: Normal.     Right lower leg: 1+ Edema present.     Left lower leg: 1+ Edema present.  Lymphadenopathy:     Cervical: No cervical adenopathy.  Skin:    General: Skin is warm and dry.  Neurological:     Mental Status: She is alert and oriented to  person, place, and time.  Psychiatric:        Attention and Perception: Attention normal.        Mood and Affect: Mood normal.        Behavior: Behavior normal. Behavior is cooperative.        Thought Content: Thought content normal.        Judgment: Judgment normal.   Results  for orders placed or performed during the hospital encounter of 02/03/21  Pregnancy, urine POC  Result Value Ref Range   Preg Test, Ur NEGATIVE NEGATIVE      Assessment & Plan:   Problem List Items Addressed This Visit       Other   Cough    For 4 days, suspect more allergic rhinitis in nature.  Recommend she start daily Xyzal or Claritin at least for next 30 days + Flonase Sensimist (does not like regular Flonase).  Will send in Tessalon to help with cough.  Overall lung auscultation reassuring.  Return in 5 weeks for follow-up, sooner if worsening symptoms present.       Menopausal symptoms    Chronic, ongoing -- has stopped birth control.  Would benefit discontinuation of this in upcoming months, discussed with her.  Discussed treatment options with patient.  Continue Gabapentin 300 MG QHS and adjust as needed, is benefiting from this.  Up to date on pap with GYN.  Educated her on return of cycles post birth control cessation.       Obesity - Primary    BMI 46.13, 6 lbs loss total at this time with Wegovy (started 03/11/21), is tolerating and seeing appetite changes + increased energy levels.  Highly recommend weight loss for overall health.  Will increase Wegovy to next step, 1.7 MG weekly, discussed with patient at length.  Recommended eating smaller high protein, low fat meals more frequently and exercising 30 mins a day 5 times a week with a goal of 10-15lb weight loss in the next 3 months. Patient voiced their understanding and motivation to adhere to these recommendations. Return in 5 weeks.        Relevant Medications   Semaglutide-Weight Management (WEGOVY) 1.7 MG/0.75ML SOAJ     Follow up  plan: Return in about 5 weeks (around 08/04/2021) for WEIGHT CHECK AND COUGH.

## 2021-06-30 NOTE — Assessment & Plan Note (Signed)
BMI 46.13, 6 lbs loss total at this time with Wegovy (started 03/11/21), is tolerating and seeing appetite changes + increased energy levels.  Highly recommend weight loss for overall health.  Will increase Wegovy to next step, 1.7 MG weekly, discussed with patient at length.  Recommended eating smaller high protein, low fat meals more frequently and exercising 30 mins a day 5 times a week with a goal of 10-15lb weight loss in the next 3 months. Patient voiced their understanding and motivation to adhere to these recommendations. Return in 5 weeks.

## 2021-06-30 NOTE — Assessment & Plan Note (Signed)
For 4 days, suspect more allergic rhinitis in nature.  Recommend she start daily Xyzal or Claritin at least for next 30 days + Flonase Sensimist (does not like regular Flonase).  Will send in Tessalon to help with cough.  Overall lung auscultation reassuring.  Return in 5 weeks for follow-up, sooner if worsening symptoms present.

## 2021-07-01 DIAGNOSIS — M5136 Other intervertebral disc degeneration, lumbar region: Secondary | ICD-10-CM | POA: Diagnosis not present

## 2021-07-01 DIAGNOSIS — M7061 Trochanteric bursitis, right hip: Secondary | ICD-10-CM | POA: Diagnosis not present

## 2021-07-01 DIAGNOSIS — R768 Other specified abnormal immunological findings in serum: Secondary | ICD-10-CM | POA: Diagnosis not present

## 2021-07-01 DIAGNOSIS — M1711 Unilateral primary osteoarthritis, right knee: Secondary | ICD-10-CM | POA: Diagnosis not present

## 2021-07-01 DIAGNOSIS — Z791 Long term (current) use of non-steroidal anti-inflammatories (NSAID): Secondary | ICD-10-CM | POA: Diagnosis not present

## 2021-07-22 ENCOUNTER — Telehealth: Payer: Self-pay | Admitting: Nurse Practitioner

## 2021-07-22 NOTE — Telephone Encounter (Signed)
Patient called, no answer, no voicemail set up. If she returns the call, please let her know the below information from the pharmacist.

## 2021-07-22 NOTE — Telephone Encounter (Signed)
Medication Refill - Medication: Semaglutide-Weight Management (WEGOVY) 1.7 MG/0.75ML SOAJ  Has the patient contacted their pharmacy? Yes.     (Agent: If yes, when and what did the pharmacy advise?) contact provider for authorization  Preferred Pharmacy (with phone number or street name):  [id number] [address] [address] Phone:  [phone]  Fax:  [phone]      Has the patient been seen for an appointment in the last year OR does the patient have an upcoming appointment? Yes.    Agent: Please be advised that RX refills may take up to 3 business days. We ask that you follow-up with your pharmacy.

## 2021-07-22 NOTE — Telephone Encounter (Signed)
[facility] called and spoke to Morris, [facility] about the refill(s) Semaglutide requested. Advised it was sent on 06/30/21 #3 m//4 refill(s). He says she picked up on 07/02/21 and what it's saying in the system is quantity limitation, meaning the insurance is requiring authorization to pick up early. She can pick up again on 07/30/21, but if requiring sooner than 30 days, the provider will need to send in authorization. Patient called to advise of when she can pick up next Rx, no answer, voicemail not set up. If she returns the call, let her know the above.

## 2021-08-02 DIAGNOSIS — D649 Anemia, unspecified: Secondary | ICD-10-CM | POA: Insufficient documentation

## 2021-08-04 ENCOUNTER — Encounter: Payer: Self-pay | Admitting: Nurse Practitioner

## 2021-08-04 ENCOUNTER — Ambulatory Visit: Payer: BC Managed Care – PPO | Admitting: Nurse Practitioner

## 2021-08-04 VITALS — BP 120/78 | HR 76 | Temp 98.1°F | Ht 66.0 in | Wt 279.8 lb

## 2021-08-04 DIAGNOSIS — D649 Anemia, unspecified: Secondary | ICD-10-CM

## 2021-08-04 DIAGNOSIS — Z6841 Body Mass Index (BMI) 40.0 and over, adult: Secondary | ICD-10-CM

## 2021-08-04 DIAGNOSIS — R768 Other specified abnormal immunological findings in serum: Secondary | ICD-10-CM

## 2021-08-04 DIAGNOSIS — N912 Amenorrhea, unspecified: Secondary | ICD-10-CM | POA: Diagnosis not present

## 2021-08-04 LAB — PREGNANCY, URINE: Preg Test, Ur: NEGATIVE

## 2021-08-04 MED ORDER — WEGOVY 2.4 MG/0.75ML ~~LOC~~ SOAJ: 2.4000 mg | SUBCUTANEOUS | 5 refills | Status: DC

## 2021-08-04 NOTE — Assessment & Plan Note (Signed)
Noted on recent rheumatology labs -- will recheck today and check iron/ferritin + B12.  Continue multivitamin and B12 at home.

## 2021-08-04 NOTE — Assessment & Plan Note (Signed)
Noted on labs, followed by rheumatology at this time, continue this collaboration.

## 2021-08-05 LAB — CBC WITH DIFFERENTIAL/PLATELET
Basophils Absolute: 0.1 10*3/uL (ref 0.0–0.2)
Basos: 1 %
EOS (ABSOLUTE): 0.1 10*3/uL (ref 0.0–0.4)
Eos: 1 %
Hematocrit: 35.3 % (ref 34.0–46.6)
Hemoglobin: 10.8 g/dL — ABNORMAL LOW (ref 11.1–15.9)
Immature Grans (Abs): 0 10*3/uL (ref 0.0–0.1)
Immature Granulocytes: 0 %
Lymphocytes Absolute: 1.5 10*3/uL (ref 0.7–3.1)
Lymphs: 23 %
MCH: 23.7 pg — ABNORMAL LOW (ref 26.6–33.0)
MCHC: 30.6 g/dL — ABNORMAL LOW (ref 31.5–35.7)
MCV: 77 fL — ABNORMAL LOW (ref 79–97)
Monocytes Absolute: 0.5 10*3/uL (ref 0.1–0.9)
Monocytes: 8 %
Neutrophils Absolute: 4.2 10*3/uL (ref 1.4–7.0)
Neutrophils: 67 %
Platelets: 210 10*3/uL (ref 150–450)
RBC: 4.56 x10E6/uL (ref 3.77–5.28)
RDW: 14.6 % (ref 11.7–15.4)
WBC: 6.3 10*3/uL (ref 3.4–10.8)

## 2021-08-05 LAB — FSH/LH
FSH: 9.2 m[IU]/mL
LH: 15.3 m[IU]/mL

## 2021-08-05 LAB — FERRITIN: Ferritin: 17 ng/mL (ref 15–150)

## 2021-08-05 LAB — IRON AND TIBC
Iron Saturation: 19 % (ref 15–55)
Iron: 62 ug/dL (ref 27–159)
Total Iron Binding Capacity: 327 ug/dL (ref 250–450)
UIBC: 265 ug/dL (ref 131–425)

## 2021-08-05 LAB — URIC ACID: Uric Acid: 4.9 mg/dL (ref 3.0–7.2)

## 2021-08-05 LAB — VITAMIN B12: Vitamin B-12: 789 pg/mL (ref 232–1245)

## 2021-08-05 NOTE — Progress Notes (Signed)
Contacted via MyChart   Good afternoon Ruth Hanson, your labs have returned.  CBC continues to show a lower sie hemoglobin, but normal hematocrit.  Iron and B12 levels are stable, continue supplements.  Uric acid level normal.  Hormone levels appear to show your are ovulating, so suspect a period will start -- we will continue to monitor this.  Any questions? Keep being stellar!!  Thank you for allowing me to participate in your care.  I appreciate you. Kindest regards, Dajanae Brophy

## 2021-09-02 ENCOUNTER — Other Ambulatory Visit: Payer: Self-pay | Admitting: Nurse Practitioner

## 2021-09-03 NOTE — Telephone Encounter (Signed)
D/C 08/02/21. Requested Prescriptions  Refused Prescriptions Disp Refills  . benzonatate (TESSALON) 100 MG capsule [Pharmacy Med Name: BENZONATATE 100 MG CAPSULE] 90 capsule 0    Sig: TAKE 1 CAPSULE BY MOUTH THREE TIMES A DAY AS NEEDED FOR COUGH     Ear, Nose, and Throat:  Antitussives/Expectorants Passed - 09/02/2021  5:02 AM      Passed - Valid encounter within last 12 months    Recent Outpatient Visits          1 month ago Class 3 severe obesity due to excess calories with serious comorbidity and body mass index (BMI) of 45.0 to 49.9 in adult [facility])   [facility] [name], [name], NP   2 months ago Class 3 severe obesity due to excess calories with serious comorbidity and body mass index (BMI) of 45.0 to 49.9 in adult [facility])   [facility] [name], [name], NP   3 months ago Class 3 severe obesity due to excess calories with serious comorbidity and body mass index (BMI) of 45.0 to 49.9 in adult [facility])   [facility] [name], [name], NP   4 months ago Class 3 severe obesity due to excess calories with serious comorbidity and body mass index (BMI) of 45.0 to 49.9 in adult [facility])   [facility] [name], [name], NP   5 months ago Class 3 severe obesity due to excess calories with serious comorbidity and body mass index (BMI) of 45.0 to 49.9 in adult [facility])   [facility] [name], Dorie Rank, NP      Future Appointments            In 1 week [name], Dorie Rank, NP [name]

## 2021-09-08 ENCOUNTER — Ambulatory Visit: Payer: BC Managed Care – PPO | Admitting: Nurse Practitioner

## 2021-09-11 ENCOUNTER — Telehealth: Payer: Self-pay | Admitting: Nurse Practitioner

## 2021-09-11 NOTE — Patient Instructions (Signed)

## 2021-09-11 NOTE — Telephone Encounter (Signed)
Pt is calling to report that the pharmacy is report that pt is in need of a PA for Semaglutide-Weight Management (WEGOVY) 2.4 MG/0.75ML SOAJ [[id number]] Please advise CB- [phone]

## 2021-09-14 ENCOUNTER — Telehealth: Payer: Self-pay

## 2021-09-14 NOTE — Telephone Encounter (Signed)
Spoke with Loraine Leriche at [facility] local [facility]. Informed him that PA for prescription was cancelled and asked pharmacy rep Loraine Leriche to re-run and it was approved. Loraine Leriche says that the patient was notified via text msg, but it may be a few days until medication comes in.

## 2021-09-14 NOTE — Telephone Encounter (Signed)
Prior authorization was initiated via CoverMyMeds for prescription Wegovy 2.4 MG. Awaiting for determination from patient's insurance.

## 2021-09-15 ENCOUNTER — Encounter: Payer: Self-pay | Admitting: Nurse Practitioner

## 2021-09-15 ENCOUNTER — Ambulatory Visit: Payer: BC Managed Care – PPO | Admitting: Nurse Practitioner

## 2021-09-15 DIAGNOSIS — I10 Essential (primary) hypertension: Secondary | ICD-10-CM | POA: Diagnosis not present

## 2021-09-15 DIAGNOSIS — R768 Other specified abnormal immunological findings in serum: Secondary | ICD-10-CM

## 2021-09-15 DIAGNOSIS — D649 Anemia, unspecified: Secondary | ICD-10-CM | POA: Diagnosis not present

## 2021-09-15 DIAGNOSIS — E66813 Obesity, class 3: Secondary | ICD-10-CM

## 2021-09-15 DIAGNOSIS — Z6841 Body Mass Index (BMI) 40.0 and over, adult: Secondary | ICD-10-CM

## 2021-09-15 DIAGNOSIS — R7689 Other specified abnormal immunological findings in serum: Secondary | ICD-10-CM

## 2021-09-15 MED ORDER — WEGOVY 2.4 MG/0.75ML ~~LOC~~ SOAJ: 2.4000 mg | SUBCUTANEOUS | 5 refills | Status: DC

## 2021-09-15 MED ORDER — HYDROCHLOROTHIAZIDE 25 MG PO TABS: 25.0000 mg | ORAL_TABLET | Freq: Every day | ORAL | 4 refills | Status: DC

## 2021-09-15 NOTE — Progress Notes (Signed)
BP 138/81   Pulse 70   Temp 98.4 F (36.9 C) (Oral)   Ht 5\' 6"  (1.676 m)   Wt 273 lb 8 oz (124.1 kg)   SpO2 99%   BMI 44.14 kg/m    Subjective:    Patient ID: Ruth , female    DOB: 08-02-1970, 50 y.o.   MRN: [id number]  HPI: Ruth Hanson is a 51 y.o. female  Chief Complaint  Patient presents with   Weight Check   Hypertension   Rheumatoid Arthritis   Arthritis   Medication Management    Patient says she is having issues with getting her [facility] medication. Patient says she is receiving the first box, and says the second box she has trouble getting it. Patient says she will go a month without her medication in between injections.    HYPERTENSION Taking HCTZ daily. Hypertension status: controlled  Satisfied with current treatment? yes Duration of hypertension: chronic BP monitoring frequency:  not checking BP range: not checking BP medication side effects:  no Medication compliance: good compliance Aspirin: no Recurrent headaches: no Visual changes: no Palpitations: no Dyspnea: no Chest pain: no Lower extremity edema: no Dizzy/lightheaded: no  The 10-year ASCVD risk score (Arnett DK, et al., 2019) is: 3.1%   Values used to calculate the score:     Age: 76 years     Sex: Female     Is Non-Hispanic African American: Yes     Diabetic: No     Tobacco smoker: No     Systolic Blood Pressure: 138 mmHg     Is BP treated: Yes     HDL Cholesterol: 85 mg/dL     Total Cholesterol: 195 mg/dL   OBESITY: She was started on Wegovy on 03/11/21, currently on 2.4 MG dosing -- does struggle with attaining this -- has been without for one month.  Has lost total of 18 pounds at this time, from 291 lbs to 273 lbs.  Currently tolerating this well without any side effects -- no nausea or GI issues. Reports ongoing decrease in appetite with this and can not eat larger meals.  Is working on 05/09/21 -- does endorse cost of healthy food is an issue.  Is working on  reducing portions. Continues to work on exercise regimen -- plans to work on this more.   Feels more energetic with medication on board.  ANEMIA & ARTHRITIS Takes multivitamin and B12 at home, recent June levels stable.  Last saw rheumatology on 07/01/21 for positive ANA -- they continue to monitor = had hip injection and continues Meloxicam. Anemia status: stable Etiology of anemia: unknown Duration of anemia treatment: none at this time Severity of anemia: mild Fatigue: yes Decreased exercise tolerance: no  Dyspnea on exertion: no Palpitations: no Bleeding: no Pica: no   Relevant past medical, surgical, family and social history reviewed and updated as indicated. Interim medical history since our last visit reviewed. Allergies and medications reviewed and updated.  Review of Systems  Constitutional:  Negative for activity change, appetite change, diaphoresis, fatigue and fever.  HENT: Negative.    Respiratory:  Negative for cough, chest tightness, shortness of breath and wheezing.   Cardiovascular:  Negative for chest pain, palpitations and leg swelling.  Gastrointestinal: Negative.   Neurological: Negative.   Psychiatric/Behavioral: Negative.      Per HPI unless specifically indicated above     Objective:    BP 138/81   Pulse 70   Temp 98.4  F (36.9 C) (Oral)   Ht 5\' 6"  (1.676 m)   Wt 273 lb 8 oz (124.1 kg)   SpO2 99%   BMI 44.14 kg/m   Wt Readings from Last 3 Encounters:  09/15/21 273 lb 8 oz (124.1 kg)  08/04/21 279 lb 12.8 oz (126.9 kg)  06/30/21 285 lb 12.8 oz (129.6 kg)    Physical Exam Vitals and nursing note reviewed.  Constitutional:      General: She is awake. She is not in acute distress.    Appearance: She is well-developed and well-groomed. She is obese. She is not ill-appearing or toxic-appearing.  HENT:     Head: Normocephalic.     Right Ear: Hearing normal.     Left Ear: Hearing normal.  Eyes:     General: Lids are normal.        Right eye:  No discharge.        Left eye: No discharge.     Conjunctiva/sclera: Conjunctivae normal.     Pupils: Pupils are equal, round, and reactive to light.  Neck:     Thyroid: No thyromegaly.     Vascular: No carotid bruit.  Cardiovascular:     Rate and Rhythm: Normal rate and regular rhythm.     Heart sounds: Normal heart sounds. No murmur heard.    No gallop.  Pulmonary:     Effort: Pulmonary effort is normal. No accessory muscle usage or respiratory distress.     Breath sounds: Normal breath sounds.  Abdominal:     General: Bowel sounds are normal. There is no distension.     Palpations: Abdomen is soft.     Tenderness: There is no abdominal tenderness.     Hernia: No hernia is present.  Musculoskeletal:     Cervical back: Normal range of motion and neck supple.     Lumbar back: Normal.     Right lower leg: No edema.     Left lower leg: No edema.  Lymphadenopathy:     Cervical: No cervical adenopathy.  Skin:    General: Skin is warm and dry.  Neurological:     Mental Status: She is alert and oriented to person, place, and time.  Psychiatric:        Attention and Perception: Attention normal.        Mood and Affect: Mood normal.        Behavior: Behavior normal. Behavior is cooperative.        Thought Content: Thought content normal.        Judgment: Judgment normal.    Results for orders placed or performed in visit on 08/05/21  HM PAP SMEAR  Result Value Ref Range   HM Pap smear See Report in Chart       Assessment & Plan:   Problem List Items Addressed This Visit       Cardiovascular and Mediastinum   Essential hypertension    Chronic, ongoing with BP at goal in office today.  Recommend she monitor BP at least a few mornings a week at home and document.  DASH diet at home.  Continue current medication regimen and adjust as needed.  Labs: up to date.  Return in 3 months.  Refills as needed.         Relevant Medications   hydrochlorothiazide (HYDRODIURIL) 25 MG  tablet     Other   Low hemoglobin    Ongoing and stable.  Noted on recent rheumatology labs.  Continue multivitamin and  B12 at home.      Obesity    BMI 44.14, 18 lbs loss total at this time with Wegovy (started 03/11/21), is tolerating and seeing appetite changes + increased energy levels.  Highly recommend weight loss for overall health.  Will continue Wegovy, 2.4 MG weekly, discussed with patient at length -- will perform PA if needed as having difficulty obtaining.  Recommended eating smaller high protein, low fat meals more frequently and exercising 30 mins a day 5 times a week with a goal of 10-15lb weight loss in the next 3 months. Patient voiced their understanding and motivation to adhere to these recommendations. Return in 3 months.       Relevant Medications   Semaglutide-Weight Management (WEGOVY) 2.4 MG/0.75ML SOAJ   Positive ANA (antinuclear antibody)    Ongoing.  Noted on labs, followed by rheumatology at this time, continue this collaboration.        Follow up plan: Return in about 3 months (around 12/16/2021) for WEIGHT CHECK -- WEGOVY.

## 2021-09-15 NOTE — Assessment & Plan Note (Addendum)
Ongoing.  Noted on labs, followed by rheumatology at this time, continue this collaboration. 

## 2021-09-15 NOTE — Assessment & Plan Note (Signed)
Chronic, ongoing with BP at goal in office today.  Recommend she monitor BP at least a few mornings a week at home and document.  DASH diet at home.  Continue current medication regimen and adjust as needed.  Labs: up to date.  Return in 3 months.  Refills as needed.

## 2021-09-15 NOTE — Assessment & Plan Note (Signed)
Ongoing and stable.  Noted on recent rheumatology labs.  Continue multivitamin and B12 at home.

## 2021-09-15 NOTE — Assessment & Plan Note (Signed)
BMI 44.14, 18 lbs loss total at this time with Wegovy (started 03/11/21), is tolerating and seeing appetite changes + increased energy levels.  Highly recommend weight loss for overall health.  Will continue Wegovy, 2.4 MG weekly, discussed with patient at length -- will perform PA if needed as having difficulty obtaining.  Recommended eating smaller high protein, low fat meals more frequently and exercising 30 mins a day 5 times a week with a goal of 10-15lb weight loss in the next 3 months. Patient voiced their understanding and motivation to adhere to these recommendations. Return in 3 months.

## 2021-09-16 ENCOUNTER — Telehealth: Payer: Self-pay | Admitting: Nurse Practitioner

## 2021-09-16 NOTE — Telephone Encounter (Signed)
Pt went to pick up her Wegovy and the pharmacy advised her they had an order for 1.7mg  and 2.4 mg/ they gave her the 1.7mg / pt would like to check with Jolene if it is ok for her to use this dose or if she should go back to pharmacy and get the 2.4 mg dose / she stated the pharmacy told her there were two orders sent to them and one was for 1.7mg  and the other for 2.4mg / please advise

## 2021-09-16 NOTE — Telephone Encounter (Signed)
Called patient to make her aware of Jolene's recommendations. Unable to leave a message due to patient voicemail not being set up.    OK for [name] to give note if patient calls back. 

## 2021-09-17 ENCOUNTER — Ambulatory Visit: Payer: Self-pay | Admitting: *Deleted

## 2021-09-17 NOTE — Telephone Encounter (Signed)
Called patient to make her aware of Jolene's recommendations. Unable to leave a message due to patient voicemail not being set up.    OK for [name] to give note if patient calls back.

## 2021-09-17 NOTE — Telephone Encounter (Signed)
Pt returned call and was read the message from Aura Dials, NP regarding the [facility] dosing dated 09/16/2021 at 10:34 AM.   She thanked me for the information.  Had no other questions.  Reason for Disposition  [1] Follow-up call to recent contact AND [2] information only call, no triage required  Answer Assessment - Initial Assessment Questions 1. REASON FOR CALL or QUESTION: "What is your reason for calling today?" or "How can I best help you?" or "What question do you have that I can help answer?"     Called in and was given the message from Aura Dials, NP regarding the [facility] dose.  Protocols used: Information Only Call - No Triage-A-AH

## 2021-10-25 ENCOUNTER — Encounter: Payer: Self-pay | Admitting: Nurse Practitioner

## 2021-10-26 ENCOUNTER — Telehealth: Payer: Self-pay | Admitting: Nurse Practitioner

## 2021-10-26 MED ORDER — WEGOVY 2.4 MG/0.75ML ~~LOC~~ SOAJ
2.4000 mg | SUBCUTANEOUS | 5 refills | Status: DC
Start: 2021-10-26 — End: 2021-11-26

## 2021-10-26 NOTE — Telephone Encounter (Signed)
Called patient to gather more information about the shingles vaccine questions the patient has. Unable to leave a message due to voicemail not being set up.   OK for [name] to gather information if patient calls back.

## 2021-10-26 NOTE — Telephone Encounter (Signed)
Copied from [place] [phone]. Topic: General - Other >> Oct 26, 2021 11:50 AM Ludger Nutting wrote: Patient would like an order to be put in for her to have a mammogram.  Patient also has questions about getting a shingles shot. Please advise patient.

## 2021-11-09 ENCOUNTER — Other Ambulatory Visit: Payer: Self-pay | Admitting: Nurse Practitioner

## 2021-11-09 DIAGNOSIS — R921 Mammographic calcification found on diagnostic imaging of breast: Secondary | ICD-10-CM

## 2021-11-25 ENCOUNTER — Encounter: Payer: Self-pay | Admitting: Nurse Practitioner

## 2021-11-26 MED ORDER — WEGOVY 2.4 MG/0.75ML ~~LOC~~ SOAJ
2.4000 mg | SUBCUTANEOUS | 5 refills | Status: DC
Start: 2021-11-26 — End: 2021-12-16

## 2021-12-13 NOTE — Patient Instructions (Signed)

## 2021-12-16 ENCOUNTER — Other Ambulatory Visit: Payer: Self-pay | Admitting: Nurse Practitioner

## 2021-12-16 ENCOUNTER — Ambulatory Visit: Payer: BC Managed Care – PPO | Admitting: Nurse Practitioner

## 2021-12-16 ENCOUNTER — Encounter: Payer: Self-pay | Admitting: Nurse Practitioner

## 2021-12-16 VITALS — BP 113/73 | HR 79 | Temp 98.5°F | Ht 66.0 in | Wt 252.5 lb

## 2021-12-16 DIAGNOSIS — Z23 Encounter for immunization: Secondary | ICD-10-CM

## 2021-12-16 DIAGNOSIS — Z6841 Body Mass Index (BMI) 40.0 and over, adult: Secondary | ICD-10-CM | POA: Diagnosis not present

## 2021-12-16 MED ORDER — WEGOVY 2.4 MG/0.75ML ~~LOC~~ SOAJ
2.4000 mg | SUBCUTANEOUS | 5 refills | Status: DC
Start: 2021-12-16 — End: 2021-12-28

## 2021-12-16 NOTE — Progress Notes (Signed)
BP 113/73   Pulse 79   Temp 98.5 F (36.9 C) (Oral)   Ht 5\' 6"  (1.676 m)   Wt 252 lb 8 oz (114.5 kg)   SpO2 98%   BMI 40.75 kg/m    Subjective:    Patient ID: Ruth , female    DOB: 24-Jan-1971, 51 y.o.   MRN: [id number]  HPI: Ruth Hanson is a 51 y.o. female  Chief Complaint  Patient presents with   Weight Check    Patient is here for a three month follow up on Weight Check. Patient says she is doing good and everything is going well. Patient says some mornings she will notice some nausea when she gets up and thinks it may be from the medicine.    OBESITY: She was started on Wegovy on 03/11/21, currently on 2.4 MG dosing -- has been able to get dosing thus far from pharmacy.  Does have occasional nausea, but this improving.  Has lost total of 18 pounds at this time, from 291 lbs to 252 lbs.  Reports ongoing decrease in appetite with this and can not eat larger meals, which she has found is beneficial. Is working on healthier diet -- does endorse cost of healthy food is an issue. Continues to eat reduced portions at meals. Continues to work on exercise regimen -- wishes to work on this more.   Feels more energetic with medication on board.  Has at this time lost total of 39 pounds.  Reports self esteem improved.    Relevant past medical, surgical, family and social history reviewed and updated as indicated. Interim medical history since our last visit reviewed. Allergies and medications reviewed and updated.  Review of Systems  Constitutional:  Negative for activity change, appetite change, diaphoresis, fatigue and fever.  HENT: Negative.    Respiratory:  Negative for cough, chest tightness, shortness of breath and wheezing.   Cardiovascular:  Negative for chest pain, palpitations and leg swelling.  Gastrointestinal: Negative.   Neurological: Negative.   Psychiatric/Behavioral: Negative.      Per HPI unless specifically indicated above     Objective:    BP  113/73   Pulse 79   Temp 98.5 F (36.9 C) (Oral)   Ht 5\' 6"  (1.676 m)   Wt 252 lb 8 oz (114.5 kg)   SpO2 98%   BMI 40.75 kg/m   Wt Readings from Last 3 Encounters:  12/16/21 252 lb 8 oz (114.5 kg)  09/15/21 273 lb 8 oz (124.1 kg)  08/04/21 279 lb 12.8 oz (126.9 kg)    Physical Exam Vitals and nursing note reviewed.  Constitutional:      General: She is awake. She is not in acute distress.    Appearance: She is well-developed and well-groomed. She is obese. She is not ill-appearing or toxic-appearing.  HENT:     Head: Normocephalic.     Right Ear: Hearing normal.     Left Ear: Hearing normal.  Eyes:     General: Lids are normal.        Right eye: No discharge.        Left eye: No discharge.     Conjunctiva/sclera: Conjunctivae normal.     Pupils: Pupils are equal, round, and reactive to light.  Neck:     Thyroid: No thyromegaly.     Vascular: No carotid bruit.  Cardiovascular:     Rate and Rhythm: Normal rate and regular rhythm.     Heart  sounds: Normal heart sounds. No murmur heard.    No gallop.  Pulmonary:     Effort: Pulmonary effort is normal. No accessory muscle usage or respiratory distress.     Breath sounds: Normal breath sounds.  Abdominal:     General: Bowel sounds are normal. There is no distension.     Palpations: Abdomen is soft.     Tenderness: There is no abdominal tenderness.     Hernia: No hernia is present.  Musculoskeletal:     Cervical back: Normal range of motion and neck supple.     Lumbar back: Normal.     Right lower leg: No edema.     Left lower leg: No edema.  Lymphadenopathy:     Cervical: No cervical adenopathy.  Skin:    General: Skin is warm and dry.  Neurological:     Mental Status: She is alert and oriented to person, place, and time.  Psychiatric:        Attention and Perception: Attention normal.        Mood and Affect: Mood normal.        Behavior: Behavior normal. Behavior is cooperative.        Thought Content: Thought  content normal.        Judgment: Judgment normal.    Results for orders placed or performed in visit on 08/05/21  HM PAP SMEAR  Result Value Ref Range   HM Pap smear See Report in Chart       Assessment & Plan:   Problem List Items Addressed This Visit       Other   Obesity - Primary    BMI 40.75, 39 lbs loss total at this time with Wegovy (started 03/11/21), is tolerating and seeing appetite changes + increased energy levels. Will continue Wegovy, 2.4 MG weekly, discussed with patient at length -- will perform PA if needed as having difficulty obtaining.  Recommended eating smaller high protein, low fat meals more frequently and exercising 30 mins a day 5 times a week with a goal of 10-15lb weight loss in the next 3 months. Patient voiced their understanding and motivation to adhere to these recommendations. Return in 3 months.       Relevant Medications   Semaglutide-Weight Management (WEGOVY) 2.4 MG/0.75ML SOAJ     Follow up plan: Return in about 3 months (around 03/18/2022) for Annual physical.

## 2021-12-16 NOTE — Telephone Encounter (Signed)
Requested medication (s) are due for refill today:   Prescribed today by Corrie Dandy  Requested medication (s) are on the active medication list:   N/A  Future visit scheduled:   Yes   Last ordered: Today 12/16/2021 3 ml, 5 refills  Returned because  this is not covered by [employer].  Requested Prescriptions  Pending Prescriptions Disp Refills   WEGOVY 2.4 MG/0.75ML SOAJ [Pharmacy Med Name: WEGOVY 2.4 MG/0.75 ML PEN]  5    Sig: Inject 2.4 mg into the skin once a week.     Endocrinology:  Diabetes - GLP-1 Receptor Agonists - semaglutide Failed - 12/16/2021 10:17 AM      Failed - HBA1C in normal range and within 180 days    Hgb A1c MFr Bld  Date Value Ref Range Status  12/31/2020 5.6 4.8 - 5.6 % Final    Comment:             Prediabetes: 5.7 - 6.4          Diabetes: >6.4          Glycemic control for adults with diabetes: <7.0          Passed - Cr in normal range and within 360 days    Creatinine, Ser  Date Value Ref Range Status  12/31/2020 0.94 0.57 - 1.00 mg/dL Final         Passed - Valid encounter within last 6 months    Recent Outpatient Visits           Today Class 3 severe obesity due to excess calories with serious comorbidity and body mass index (BMI) of 40.0 to 44.9 in adult [facility])   [facility] [name], [name], NP   3 months ago Class 3 severe obesity due to excess calories with serious comorbidity and body mass index (BMI) of 40.0 to 44.9 in adult [facility])   [facility] [name], [name], NP   4 months ago Class 3 severe obesity due to excess calories with serious comorbidity and body mass index (BMI) of 45.0 to 49.9 in adult [facility])   [facility] [name], [name], NP   5 months ago Class 3 severe obesity due to excess calories with serious comorbidity and body mass index (BMI) of 45.0 to 49.9 in adult [facility])   [facility] [name], [name], NP   7 months ago Class 3 severe obesity due to excess calories with  serious comorbidity and body mass index (BMI) of 45.0 to 49.9 in adult [facility])   [facility] [name], Dorie Rank, NP       Future Appointments             In 3 months [name], Dorie Rank, NP [employer], [name]

## 2021-12-16 NOTE — Telephone Encounter (Signed)
  Notes to clinic:  Pharmacy called for clarification since med has come back twice. Pharmacist stated it is a different kind of prior authorization than usual and the number to speak to the correct person is [phone] and the ID number is [id number] and the pt is the card holder.      Requested Prescriptions  Pending Prescriptions Disp Refills   WEGOVY 2.4 MG/0.75ML SOAJ [Pharmacy Med Name: WEGOVY 2.4 MG/0.75 ML PEN]  5    Sig: Inject 2.4 mg into the skin once a week.     Endocrinology:  Diabetes - GLP-1 Receptor Agonists - semaglutide Failed - 12/16/2021 12:48 PM      Failed - HBA1C in normal range and within 180 days    Hgb A1c MFr Bld  Date Value Ref Range Status  12/31/2020 5.6 4.8 - 5.6 % Final    Comment:             Prediabetes: 5.7 - 6.4          Diabetes: >6.4          Glycemic control for adults with diabetes: <7.0          Passed - Cr in normal range and within 360 days    Creatinine, Ser  Date Value Ref Range Status  12/31/2020 0.94 0.57 - 1.00 mg/dL Final         Passed - Valid encounter within last 6 months    Recent Outpatient Visits           Today Class 3 severe obesity due to excess calories with serious comorbidity and body mass index (BMI) of 40.0 to 44.9 in adult [facility])   [facility] [name], [name], NP   3 months ago Class 3 severe obesity due to excess calories with serious comorbidity and body mass index (BMI) of 40.0 to 44.9 in adult [facility])   [facility] [name], [name], NP   4 months ago Class 3 severe obesity due to excess calories with serious comorbidity and body mass index (BMI) of 45.0 to 49.9 in adult [facility])   [facility] [name], [name], NP   5 months ago Class 3 severe obesity due to excess calories with serious comorbidity and body mass index (BMI) of 45.0 to 49.9 in adult [facility])   [facility] [name], [name], NP   7 months ago Class 3 severe obesity due to excess calories with  serious comorbidity and body mass index (BMI) of 45.0 to 49.9 in adult [facility])   [facility] [name], Dorie Rank, NP       Future Appointments             In 3 months [name], Dorie Rank, NP [employer], [name]

## 2021-12-16 NOTE — Assessment & Plan Note (Signed)
BMI 40.75, 39 lbs loss total at this time with Wegovy (started 03/11/21), is tolerating and seeing appetite changes + increased energy levels. Will continue Wegovy, 2.4 MG weekly, discussed with patient at length -- will perform PA if needed as having difficulty obtaining.  Recommended eating smaller high protein, low fat meals more frequently and exercising 30 mins a day 5 times a week with a goal of 10-15lb weight loss in the next 3 months. Patient voiced their understanding and motivation to adhere to these recommendations. Return in 3 months.

## 2021-12-18 ENCOUNTER — Ambulatory Visit
Admission: RE | Admit: 2021-12-18 | Discharge: 2021-12-18 | Disposition: A | Discharge status: Home / Self Care | Payer: BC Managed Care – PPO | Source: Ambulatory Visit | Attending: Nurse Practitioner | Admitting: Nurse Practitioner

## 2021-12-18 DIAGNOSIS — R921 Mammographic calcification found on diagnostic imaging of breast: Secondary | ICD-10-CM | POA: Insufficient documentation

## 2021-12-19 NOTE — Progress Notes (Signed)
Contacted via MyChart   Normal mammogram, may repeat in one year:)

## 2021-12-27 ENCOUNTER — Encounter: Payer: Self-pay | Admitting: Nurse Practitioner

## 2021-12-28 ENCOUNTER — Other Ambulatory Visit: Payer: Self-pay

## 2021-12-28 MED ORDER — WEGOVY 2.4 MG/0.75ML ~~LOC~~ SOAJ: 2.4000 mg | SUBCUTANEOUS | 5 refills | Status: DC

## 2021-12-28 NOTE — Telephone Encounter (Signed)
Medication refill for Wegovy last ov , upcoming ov 03/18/21 . Please advise

## 2022-01-04 DIAGNOSIS — M5136 Other intervertebral disc degeneration, lumbar region: Secondary | ICD-10-CM | POA: Diagnosis not present

## 2022-01-04 DIAGNOSIS — Z791 Long term (current) use of non-steroidal anti-inflammatories (NSAID): Secondary | ICD-10-CM | POA: Diagnosis not present

## 2022-01-04 DIAGNOSIS — M1711 Unilateral primary osteoarthritis, right knee: Secondary | ICD-10-CM | POA: Diagnosis not present

## 2022-01-04 DIAGNOSIS — R768 Other specified abnormal immunological findings in serum: Secondary | ICD-10-CM | POA: Diagnosis not present

## 2022-01-22 ENCOUNTER — Other Ambulatory Visit: Payer: Self-pay | Admitting: Nurse Practitioner

## 2022-01-22 NOTE — Telephone Encounter (Signed)
Copied from CRM [id number]. Topic: General - Other >> Jan 22, 2022  9:56 AM Dondra Prader A wrote: Reason for CRM: Medication Refill - Medication: Semaglutide-Weight Management (WEGOVY) 2.4 MG/0.75ML SOAJ  Has the patient contacted their pharmacy? Yes.   Pharmacy referred patient to PCP  Preferred Pharmacy (with phone number or street name): [id number] [place] [address]  Phone: [phone] Fax: [phone]    Has the patient been seen for an appointment in the last year OR does the patient have an upcoming appointment? Yes.    Agent: Please be advised that RX refills may take up to 3 business days. We ask that you follow-up with your pharmacy.

## 2022-01-22 NOTE — Telephone Encounter (Signed)
On 12/28/2021 refill with 5 month supply - confirmed by same pharmacy Requested Prescriptions  Pending Prescriptions Disp Refills   Semaglutide-Weight Management (WEGOVY) 2.4 MG/0.75ML SOAJ 3 mL 5    Sig: Inject 2.4 mg into the skin once a week.     Endocrinology:  Diabetes - GLP-1 Receptor Agonists - semaglutide Failed - 01/22/2022 11:20 AM      Failed - HBA1C in normal range and within 180 days    Hgb A1c MFr Bld  Date Value Ref Range Status  12/31/2020 5.6 4.8 - 5.6 % Final    Comment:             Prediabetes: 5.7 - 6.4          Diabetes: >6.4          Glycemic control for adults with diabetes: <7.0          Failed - Cr in normal range and within 360 days    Creatinine, Ser  Date Value Ref Range Status  12/31/2020 0.94 0.57 - 1.00 mg/dL Final         Passed - Valid encounter within last 6 months    Recent Outpatient Visits           1 month ago Class 3 severe obesity due to excess calories with serious comorbidity and body mass index (BMI) of 40.0 to 44.9 in adult [facility])   [facility] [name], [name], NP   4 months ago Class 3 severe obesity due to excess calories with serious comorbidity and body mass index (BMI) of 40.0 to 44.9 in adult [facility])   [facility] [name], [name], NP   5 months ago Class 3 severe obesity due to excess calories with serious comorbidity and body mass index (BMI) of 45.0 to 49.9 in adult [facility])   [facility] [name], [name], NP   6 months ago Class 3 severe obesity due to excess calories with serious comorbidity and body mass index (BMI) of 45.0 to 49.9 in adult [facility])   [facility] [name], [name], NP   8 months ago Class 3 severe obesity due to excess calories with serious comorbidity and body mass index (BMI) of 45.0 to 49.9 in adult [facility])   [facility] [name], Dorie Rank, NP       Future Appointments             In 1 month [name], Dorie Rank, NP [employer],  [name]             .

## 2022-02-17 DIAGNOSIS — M722 Plantar fascial fibromatosis: Secondary | ICD-10-CM | POA: Diagnosis not present

## 2022-02-17 DIAGNOSIS — M79671 Pain in right foot: Secondary | ICD-10-CM | POA: Diagnosis not present

## 2022-02-17 DIAGNOSIS — M7731 Calcaneal spur, right foot: Secondary | ICD-10-CM | POA: Diagnosis not present

## 2022-02-22 ENCOUNTER — Other Ambulatory Visit: Payer: Self-pay | Admitting: Nurse Practitioner

## 2022-02-22 NOTE — Telephone Encounter (Signed)
Medication Refill - Medication: Semaglutide-Weight Management (WEGOVY) 2.4 MG/0.75ML SOAJ   Has the patient contacted their pharmacy? No   Preferred Pharmacy (with phone number or street name):  [id number] [place] [address] Phone: [phone]  Fax: [phone]     Has the patient been seen for an appointment in the last year OR does the patient have an upcoming appointment? Yes.    Please assist patient further

## 2022-02-22 NOTE — Telephone Encounter (Signed)
Requested Prescriptions  Pending Prescriptions Disp Refills   gabapentin (NEURONTIN) 600 MG tablet [Pharmacy Med Name: GABAPENTIN 600 MG TABLET] 45 tablet 9    Sig: TAKE 0.5 TABLETS (300 MG TOTAL) BY MOUTH AT BEDTIME.     Neurology: Anticonvulsants - gabapentin Failed - 02/22/2022  9:48 AM      Failed - Cr in normal range and within 360 days    Creatinine, Ser  Date Value Ref Range Status  12/31/2020 0.94 0.57 - 1.00 mg/dL Final         Failed - Completed PHQ-2 or PHQ-9 in the last 360 days      Passed - Valid encounter within last 12 months    Recent Outpatient Visits           2 months ago Class 3 severe obesity due to excess calories with serious comorbidity and body mass index (BMI) of 40.0 to 44.9 in adult [facility])   [place], [name], NP   5 months ago Class 3 severe obesity due to excess calories with serious comorbidity and body mass index (BMI) of 40.0 to 44.9 in adult [facility])   [place], [name], NP   6 months ago Class 3 severe obesity due to excess calories with serious comorbidity and body mass index (BMI) of 45.0 to 49.9 in adult [facility])   [place], [name], NP   7 months ago Class 3 severe obesity due to excess calories with serious comorbidity and body mass index (BMI) of 45.0 to 49.9 in adult [facility])   [place], [name], NP   9 months ago Class 3 severe obesity due to excess calories with serious comorbidity and body mass index (BMI) of 45.0 to 49.9 in adult [facility])   [place], Henrine Screws T, NP       Future Appointments             In 3 weeks [name], [name], NP [facility], [name]             benzonatate (TESSALON) 100 MG capsule [Pharmacy Med Name: BENZONATATE 100 MG CAPSULE] 90 capsule 0    Sig: TAKE 1 CAPSULE BY MOUTH THREE TIMES A DAY AS NEEDED FOR COUGH     Ear, Nose, and Throat:  Antitussives/Expectorants Passed - 02/22/2022  9:48  AM      Passed - Valid encounter within last 12 months    Recent Outpatient Visits           2 months ago Class 3 severe obesity due to excess calories with serious comorbidity and body mass index (BMI) of 40.0 to 44.9 in adult [facility])   [place], [name], NP   5 months ago Class 3 severe obesity due to excess calories with serious comorbidity and body mass index (BMI) of 40.0 to 44.9 in adult [facility])   [place], [name], NP   6 months ago Class 3 severe obesity due to excess calories with serious comorbidity and body mass index (BMI) of 45.0 to 49.9 in adult [facility])   [place], [name], NP   7 months ago Class 3 severe obesity due to excess calories with serious comorbidity and body mass index (BMI) of 45.0 to 49.9 in adult [facility])   [place], [name], NP   9 months ago Class 3 severe obesity due to excess calories with serious comorbidity  and body mass index (BMI) of 45.0 to 49.9 in adult [facility])   [place], [name], NP       Future Appointments             In 3 weeks [name], [name], NP [facility], [name]

## 2022-02-22 NOTE — Telephone Encounter (Signed)
[facility] [phone] called and spoke to [place], [facility] about the refill(s) [facility] 2.4mg  requested. Advised it was sent on 12/28/21 #62mL/5 refill(s). She advised rx was on backorder and unsure when will come back in stock. Advised her I will let pt and provider know. No other alternative recommended at this time d/t others being on backorder as well. She did recommend the pt call other retailers to see if they have in stock.   Called pt to notify of above information but VM not set up so unable to LVM.

## 2022-02-23 MED ORDER — WEGOVY 2.4 MG/0.75ML ~~LOC~~ SOAJ: 2.4000 mg | SUBCUTANEOUS | 5 refills | Status: DC

## 2022-02-24 ENCOUNTER — Other Ambulatory Visit: Payer: Self-pay | Admitting: Nurse Practitioner

## 2022-02-24 NOTE — Telephone Encounter (Signed)
Requested medication (s) are due for refill today: Alternative Requested:NEEDS PA.   Requested medication (s) are on the active medication list:yes  Last refill:  02/23/22  Future visit scheduled: yes  Notes to clinic:   Alternative Requested:NEEDS PA.      Requested Prescriptions  Pending Prescriptions Disp Refills   WEGOVY 2.4 MG/0.75ML SOAJ [Pharmacy Med Name: WEGOVY 2.4 MG/0.75 ML PEN]  5    Sig: Inject 2.4 mg into the skin once a week.     Endocrinology:  Diabetes - GLP-1 Receptor Agonists - semaglutide Failed - 02/24/2022  3:10 PM      Failed - HBA1C in normal range and within 180 days    Hgb A1c MFr Bld  Date Value Ref Range Status  12/31/2020 5.6 4.8 - 5.6 % Final    Comment:             Prediabetes: 5.7 - 6.4          Diabetes: >6.4          Glycemic control for adults with diabetes: <7.0          Failed - Cr in normal range and within 360 days    Creatinine, Ser  Date Value Ref Range Status  12/31/2020 0.94 0.57 - 1.00 mg/dL Final         Passed - Valid encounter within last 6 months    Recent Outpatient Visits           2 months ago Class 3 severe obesity due to excess calories with serious comorbidity and body mass index (BMI) of 40.0 to 44.9 in adult [facility])   [place], [name], NP   5 months ago Class 3 severe obesity due to excess calories with serious comorbidity and body mass index (BMI) of 40.0 to 44.9 in adult [facility])   [place], [name], NP   6 months ago Class 3 severe obesity due to excess calories with serious comorbidity and body mass index (BMI) of 45.0 to 49.9 in adult [facility])   [place], [name], NP   7 months ago Class 3 severe obesity due to excess calories with serious comorbidity and body mass index (BMI) of 45.0 to 49.9 in adult [facility])   [place], [name], NP   9 months ago Class 3 severe obesity due to excess calories with serious comorbidity and  body mass index (BMI) of 45.0 to 49.9 in adult [facility])   [place], [name], NP       Future Appointments             In 3 weeks [name], [name], NP [facility], [name]

## 2022-03-10 DIAGNOSIS — M722 Plantar fascial fibromatosis: Secondary | ICD-10-CM | POA: Diagnosis not present

## 2022-03-14 NOTE — Patient Instructions (Signed)

## 2022-03-18 ENCOUNTER — Encounter: Payer: Self-pay | Admitting: Nurse Practitioner

## 2022-03-18 ENCOUNTER — Ambulatory Visit (INDEPENDENT_AMBULATORY_CARE_PROVIDER_SITE_OTHER): Payer: BC Managed Care – PPO | Admitting: Nurse Practitioner

## 2022-03-18 VITALS — BP 128/79 | HR 91 | Temp 98.9°F | Wt 233.5 lb

## 2022-03-18 DIAGNOSIS — Z13220 Encounter for screening for lipoid disorders: Secondary | ICD-10-CM | POA: Diagnosis not present

## 2022-03-18 DIAGNOSIS — E559 Vitamin D deficiency, unspecified: Secondary | ICD-10-CM

## 2022-03-18 DIAGNOSIS — R252 Cramp and spasm: Secondary | ICD-10-CM | POA: Diagnosis not present

## 2022-03-18 DIAGNOSIS — K219 Gastro-esophageal reflux disease without esophagitis: Secondary | ICD-10-CM

## 2022-03-18 DIAGNOSIS — Z6837 Body mass index (BMI) 37.0-37.9, adult: Secondary | ICD-10-CM

## 2022-03-18 DIAGNOSIS — I10 Essential (primary) hypertension: Secondary | ICD-10-CM

## 2022-03-18 DIAGNOSIS — Z136 Encounter for screening for cardiovascular disorders: Secondary | ICD-10-CM | POA: Diagnosis not present

## 2022-03-18 DIAGNOSIS — Z0000 Encounter for general adult medical examination without abnormal findings: Secondary | ICD-10-CM | POA: Diagnosis not present

## 2022-03-18 DIAGNOSIS — D649 Anemia, unspecified: Secondary | ICD-10-CM

## 2022-03-18 DIAGNOSIS — M159 Polyosteoarthritis, unspecified: Secondary | ICD-10-CM

## 2022-03-18 DIAGNOSIS — N951 Menopausal and female climacteric states: Secondary | ICD-10-CM

## 2022-03-18 DIAGNOSIS — R768 Other specified abnormal immunological findings in serum: Secondary | ICD-10-CM

## 2022-03-18 MED ORDER — WEGOVY 2.4 MG/0.75ML ~~LOC~~ SOAJ: 2.4000 mg | SUBCUTANEOUS | 5 refills | Status: DC

## 2022-03-18 MED ORDER — GABAPENTIN 600 MG PO TABS: 300.0000 mg | ORAL_TABLET | Freq: Every day | ORAL | 4 refills | Status: DC

## 2022-03-18 NOTE — Assessment & Plan Note (Signed)
Chronic, stable.  BP at goal today in office.  Recommend she monitor BP at least a few mornings a week at home and document.  DASH diet at home.  Continue current medication regimen and adjust as needed.  Labs: CBC, CMP, TSH.  Refills up to date.

## 2022-03-18 NOTE — Assessment & Plan Note (Signed)
BMI 37.69,  58 lbs loss total at this time with Wegovy (started 03/11/21), is tolerating and seeing appetite changes + increased energy levels. Will continue Wegovy, 2.4 MG weekly, discussed with patient at length -- will perform PA if needed as having difficulty obtaining.  Recommended eating smaller high protein, low fat meals more frequently and exercising 30 mins a day 5 times a week with a goal of 10-15lb weight loss in the next 3 months. Patient voiced their understanding and motivation to adhere to these recommendations. Return in 3 months.

## 2022-03-18 NOTE — Assessment & Plan Note (Signed)
Chronic, ongoing -- continue BCP and Gabapentin.  Up to date on pap with GYN.

## 2022-03-18 NOTE — Assessment & Plan Note (Signed)
Chronic, improved with Prilosec as needed.  Continue current regimen and adjust as needed.  Educated her on Prilosec and side effects.  If ongoing or worsening consider GI referral. Mag level today.

## 2022-03-18 NOTE — Assessment & Plan Note (Signed)
Ongoing.  Noted on labs, followed by rheumatology at this time, continue this collaboration.

## 2022-03-18 NOTE — Assessment & Plan Note (Addendum)
To left knee, hip, and lower back.  Suspect more OA, but had positive ANA.  Continue Meloxicam and collaboration with rheumatology at this time.  May take Tylenol as needed, up to 3000 MG daily + recommend use of Voltaren gel as needed.

## 2022-03-18 NOTE — Progress Notes (Signed)
BP 128/79   Pulse 91   Temp 98.9 F (37.2 C) (Oral)   Wt 233 lb 8 oz (105.9 kg)   SpO2 99%   BMI 37.69 kg/m    Subjective:    Patient ID: Ruth Adelene Amas, female    DOB: 10-13-1970, 52 y.o.   MRN: [id number]  HPI: Ruth Hanson is a 52 y.o. female presenting on 03/18/2022 for comprehensive medical examination. Current medical complaints include:none  She currently lives with: lives with daughter Menopausal Symptoms: yes -- hot flashes at night time -- Gabapentin and birth control used for symptom control  HYPERTENSION Taking HCTZ daily. Hypertension status: controlled  Satisfied with current treatment? yes Duration of hypertension: chronic BP monitoring frequency:  not checking BP range: not checking BP medication side effects:  no Medication compliance: good compliance Aspirin: no Recurrent headaches: no Visual changes: no Palpitations: no Dyspnea: no Chest pain: no Lower extremity edema: no Dizzy/lightheaded: no  The 10-year ASCVD risk score (Arnett DK, et al., 2019) is: 2.3%   Values used to calculate the score:     Age: 69 years     Sex: Female     Is Non-Hispanic African American: Yes     Diabetic: No     Tobacco smoker: No     Systolic Blood Pressure: 128 mmHg     Is BP treated: Yes     HDL Cholesterol: 85 mg/dL     Total Cholesterol: 195 mg/dL  OBESITY: She was started on Wegovy on 03/11/21, currently on 2.4 MG dosing.  Has lost total of 58 pounds at this time, from 291 lbs to 233 lbs.  Currently tolerating this well without any side effects -- no nausea or GI issues. Reports ongoing decrease in appetite with this and can not eat larger meals.  Is working on [employer], but does endorse cost of healthier can be a struggle.  Is working on reducing portions. Continues to work on exercise regimen.   Feels more energetic with medication on board. Does take Omeprazole as needed for heart burn.   ANEMIA & ARTHRITIS Takes multivitamin and B12 at home.  Last  saw rheumatology on 01/04/22 for positive ANA -- they continue to monitor and continue Meloxicam. Anemia status: stable Etiology of anemia: unknown Duration of anemia treatment: none at this time Severity of anemia: mild Fatigue: yes Decreased exercise tolerance: no  Dyspnea on exertion: no Palpitations: no Bleeding: no Pica: no      03/18/2022    8:04 AM 12/31/2020    9:24 AM  Depression screen PHQ 2/9  Decreased Interest 0 0  Down, Depressed, Hopeless 0 0  PHQ - 2 Score 0 0  Altered sleeping 0 0  Tired, decreased energy 0 3  Change in appetite 0 0  Feeling bad or failure about yourself  0 0  Trouble concentrating 0 0  Moving slowly or fidgety/restless 0 0  Suicidal thoughts 0 0  PHQ-9 Score 0 3  Difficult doing work/chores Not difficult at all        11/04/2020   12:33 PM 12/31/2020    9:24 AM 02/03/2021    7:42 AM 03/18/2022    8:04 AM 03/18/2022    8:21 AM  Fall Risk  Falls in the past year?  0  0 0  Was there an injury with Fall?  0  0 0  Fall Risk Category Calculator  0  0 0  Fall Risk Category (Retired)  Low     (  RETIRED) Patient Fall Risk Level Low fall risk Low fall risk Low fall risk    Patient at Risk for Falls Due to  No Fall Risks  No Fall Risks No Fall Risks  Fall risk Follow up  Falls evaluation completed  Falls evaluation completed Falls prevention discussed    Functional Status Survey: Is the patient deaf or have difficulty hearing?: No Does the patient have difficulty seeing, even when wearing glasses/contacts?: No Does the patient have difficulty concentrating, remembering, or making decisions?: No Does the patient have difficulty walking or climbing stairs?: No Does the patient have difficulty dressing or bathing?: No Does the patient have difficulty doing errands alone such as visiting a doctor's office or shopping?: No   Past Medical History:  History reviewed. No pertinent past medical history.  Surgical History:  Past Surgical History:   Procedure Laterality Date   BREAST BIOPSY Left 12/19/2019   6:00 4cmfn x clip path pending   COLONOSCOPY WITH PROPOFOL N/A 02/03/2021   Procedure: COLONOSCOPY WITH PROPOFOL;  Surgeon: Lucilla Lame, MD;  Location: [facility] END[facility]COPY;  Service: Endoscopy;  Laterality: N/A;    Medications:  Current Outpatient Medications on File Prior to Visit  Medication Sig   Cholecalciferol (VITAMIN D3) 1.25 MG (50000 UT) CAPS Take 1 capsule by mouth once a week.   [facility] VITAMIN B12 1000 MCG tablet Take 1 tablet (1,000 mcg total) by mouth daily.   FOLITAB 500 105-500-0.8 MG TBCR Take 1 tablet by mouth daily.   hydrochlorothiazide (HYDRODIURIL) 25 MG tablet Take 1 tablet (25 mg total) by mouth daily.   meloxicam (MOBIC) 7.5 MG tablet Take 7.5 mg by mouth daily as needed.   [name] 3-0.02 MG tablet Take 1 tablet by mouth daily.   omeprazole (PRILOSEC) 20 MG capsule Take 1 capsule (20 mg total) by mouth daily.   No current facility-administered medications on file prior to visit.    Allergies:  No Known Allergies  Social History:  Social History   Socioeconomic History   Marital status: Single    Spouse name: Not on file   Number of children: Not on file   Years of education: Not on file   Highest education level: Not on file  Occupational History   Not on file  Tobacco Use   Smoking status: Never   Smokeless tobacco: Never  Vaping Use   Vaping Use: Never used  Substance and Sexual Activity   Alcohol use: Never   Drug use: Never   Sexual activity: Not on file  Other Topics Concern   Not on file  Social History Narrative   Not on file   Social Determinants of Health   Financial Resource Strain: Low Risk  (12/31/2020)   Overall Financial Resource Strain (CARDIA)    Difficulty of Paying Living Expenses: Not hard at all  Food Insecurity: No Food Insecurity (12/31/2020)   Hunger Vital Sign    Worried About Running Out of Food in the Last Year: Never true    [place] in the Last Year:  Never true  Transportation Needs: No Transportation Needs (12/31/2020)   PRAPARE - Hydrologist (Medical): No    Lack of Transportation (Non-Medical): No  Physical Activity: Inactive (12/31/2020)   Exercise Vital Sign    Days of Exercise per Week: 0 days    Minutes of Exercise per Session: 0 min  Stress: No Stress Concern Present (12/31/2020)   [place]  Questionnaire    Feeling of Stress : Only a little  Social Connections: Socially Isolated (12/31/2020)   Social Connection and Isolation Panel [NHANES]    Frequency of Communication with Friends and Family: Twice a week    Frequency of Social Gatherings with Friends and Family: Twice a week    Attends Religious Services: Never    Database administrator or Organizations: No    Attends Banker Meetings: Never    Marital Status: Separated  Intimate Partner Violence: Not At Risk (12/31/2020)   Humiliation, Afraid, Rape, and Kick questionnaire    Fear of Current or Ex-Partner: No    Emotionally Abused: No    Physically Abused: No    Sexually Abused: No   Social History   Tobacco Use  Smoking Status Never  Smokeless Tobacco Never   Social History   Substance and Sexual Activity  Alcohol Use Never    Family History:  Family History  Problem Relation Age of Onset   Cancer Mother 24       stomach   Hypertension Mother    Aneurysm Sister    GER disease Brother     Past medical history, surgical history, medications, allergies, family history and social history reviewed with patient today and changes made to appropriate areas of the chart.   ROS All other ROS negative except what is listed above and in the HPI.      Objective:    BP 128/79   Pulse 91   Temp 98.9 F (37.2 C) (Oral)   Wt 233 lb 8 oz (105.9 kg)   SpO2 99%   BMI 37.69 kg/m   Wt Readings from Last 3 Encounters:  03/18/22 233 lb 8 oz (105.9 kg)  12/16/21 252  lb 8 oz (114.5 kg)  09/15/21 273 lb 8 oz (124.1 kg)    Physical Exam Vitals and nursing note reviewed. Exam conducted with a chaperone present.  Constitutional:      General: She is awake. She is not in acute distress.    Appearance: She is well-developed and well-groomed. She is obese. She is not ill-appearing or toxic-appearing.  HENT:     Head: Normocephalic and atraumatic.     Right Ear: Hearing, tympanic membrane, ear canal and external ear normal. No drainage.     Left Ear: Hearing, tympanic membrane, ear canal and external ear normal. No drainage.     Nose: Nose normal.     Right Sinus: No maxillary sinus tenderness or frontal sinus tenderness.     Left Sinus: No maxillary sinus tenderness or frontal sinus tenderness.     Mouth/Throat:     Mouth: Mucous membranes are moist.     Pharynx: Oropharynx is clear. Uvula midline. No pharyngeal swelling, oropharyngeal exudate or posterior oropharyngeal erythema.  Eyes:     General: Lids are normal.        Right eye: No discharge.        Left eye: No discharge.     Extraocular Movements: Extraocular movements intact.     Conjunctiva/sclera: Conjunctivae normal.     Pupils: Pupils are equal, round, and reactive to light.     Visual Fields: Right eye visual fields normal and left eye visual fields normal.  Neck:     Thyroid: No thyromegaly.     Vascular: No carotid bruit.     Trachea: Trachea normal.  Cardiovascular:     Rate and Rhythm: Normal rate and regular rhythm.  Heart sounds: Normal heart sounds. No murmur heard.    No gallop.  Pulmonary:     Effort: Pulmonary effort is normal. No accessory muscle usage or respiratory distress.     Breath sounds: Normal breath sounds.  Chest:  Breasts:    Right: Normal.     Left: Normal.  Abdominal:     General: Bowel sounds are normal.     Palpations: Abdomen is soft. There is no hepatomegaly or splenomegaly.     Tenderness: There is no abdominal tenderness.  Musculoskeletal:         General: Normal range of motion.     Cervical back: Normal range of motion and neck supple.     Right lower leg: No edema.     Left lower leg: No edema.  Lymphadenopathy:     Head:     Right side of head: No submental, submandibular, tonsillar, preauricular or posterior auricular adenopathy.     Left side of head: No submental, submandibular, tonsillar, preauricular or posterior auricular adenopathy.     Cervical: No cervical adenopathy.     Upper Body:     Right upper body: No supraclavicular, axillary or pectoral adenopathy.     Left upper body: No supraclavicular, axillary or pectoral adenopathy.  Skin:    General: Skin is warm and dry.     Capillary Refill: Capillary refill takes less than 2 seconds.     Findings: No rash.  Neurological:     Mental Status: She is alert and oriented to person, place, and time.     Gait: Gait is intact.     Deep Tendon Reflexes: Reflexes are normal and symmetric.     Reflex Scores:      Brachioradialis reflexes are 2+ on the right side and 2+ on the left side.      Patellar reflexes are 2+ on the right side and 2+ on the left side. Psychiatric:        Attention and Perception: Attention normal.        Mood and Affect: Mood normal.        Speech: Speech normal.        Behavior: Behavior normal. Behavior is cooperative.        Thought Content: Thought content normal.        Judgment: Judgment normal.    Results for orders placed or performed in visit on 08/05/21  HM PAP SMEAR  Result Value Ref Range   HM Pap smear See Report in Chart       Assessment & Plan:   Problem List Items Addressed This Visit       Cardiovascular and Mediastinum   Essential hypertension    Chronic, stable.  BP at goal today in office.  Recommend she monitor BP at least a few mornings a week at home and document.  DASH diet at home.  Continue current medication regimen and adjust as needed.  Labs: CBC, CMP, TSH.  Refills up to date.        Relevant Orders    CBC with Differential/Platelet   Comprehensive metabolic panel   TSH     Digestive   Gastroesophageal reflux disease without esophagitis    Chronic, improved with Prilosec as needed.  Continue current regimen and adjust as needed.  Educated her on Prilosec and side effects.  If ongoing or worsening consider GI referral. Mag level today.      Relevant Orders   Magnesium     Musculoskeletal and  Integument   Primary osteoarthritis involving multiple joints    To left knee, hip, and lower back.  Suspect more OA, but had positive ANA.  Continue Meloxicam and collaboration with rheumatology at this time.  May take Tylenol as needed, up to 3000 MG daily + recommend use of Voltaren gel as needed.        Relevant Orders   VITAMIN D 25 Hydroxy (Vit-D Deficiency, Fractures)     Other   Low hemoglobin    Ongoing and stable.  Noted on rheumatology labs.  Continue multivitamin and B12 at home.      Relevant Orders   CBC with Differential/Platelet   Ferritin   Iron Binding Cap (TIBC)([facility])   Vitamin B12   Menopausal symptoms    Chronic, ongoing -- continue BCP and Gabapentin.  Up to date on pap with GYN.       Obesity - Primary    BMI 37.69,  58 lbs loss total at this time with Wegovy (started 03/11/21), is tolerating and seeing appetite changes + increased energy levels. Will continue Wegovy, 2.4 MG weekly, discussed with patient at length -- will perform PA if needed as having difficulty obtaining.  Recommended eating smaller high protein, low fat meals more frequently and exercising 30 mins a day 5 times a week with a goal of 10-15lb weight loss in the next 3 months. Patient voiced their understanding and motivation to adhere to these recommendations. Return in 3 months.       Relevant Medications   Semaglutide-Weight Management (WEGOVY) 2.4 MG/0.75ML SOAJ   Positive ANA (antinuclear antibody)    Ongoing.  Noted on labs, followed by rheumatology at this time, continue this  collaboration.      Vitamin D deficiency    Noted past labs, continue supplement and recheck level today.      Relevant Orders   VITAMIN D 25 Hydroxy (Vit-D Deficiency, Fractures)   Other Visit Diagnoses     Encounter for lipid screening for cardiovascular disease       Lipid panel on labs today.   Relevant Orders   Lipid Panel w/o Chol/HDL Ratio   Encounter for annual physical exam       Annual physical today with labs and health maintenance reviewed, discussed with patient.        Follow up plan: Return in about 3 months (around 06/16/2022) for WEIGHT CHECK -- DJSHFW.   LABORATORY TESTING:  - Pap smear: up to date  IMMUNIZATIONS:   - Tdap: Tetanus vaccination status reviewed: last tetanus booster within 10 years. - Influenza: Up to date - Pneumovax: Not applicable - Prevnar: Not applicable - COVID: Up to date - HPV: Not applicable - Shingrix vaccine: Refused  SCREENING: -Mammogram: Up to date  - Colonoscopy: Up to date  - Bone Density: Not applicable  -Hearing Test: Not applicable  -Spirometry: Not applicable   PATIENT COUNSELING:   Advised to take 1 mg of folate supplement per day if capable of pregnancy.   Sexuality: Discussed sexually transmitted diseases, partner selection, use of condoms, avoidance of unintended pregnancy  and contraceptive alternatives.   Advised to avoid cigarette smoking.  I discussed with the patient that most people either abstain from alcohol or drink within safe limits (<=14/week and <=4 drinks/occasion for males, <=7/weeks and <= 3 drinks/occasion for females) and that the risk for alcohol disorders and other health effects rises proportionally with the number of drinks per week and how often a drinker exceeds daily limits.  Discussed cessation/primary prevention of drug use and availability of treatment for abuse.   Diet: Encouraged to adjust caloric intake to maintain  or achieve ideal body weight, to reduce intake of dietary  saturated fat and total fat, to limit sodium intake by avoiding high sodium foods and not adding table salt, and to maintain adequate dietary potassium and calcium preferably from fresh fruits, vegetables, and low-fat dairy products.    Stressed the importance of regular exercise  Injury prevention: Discussed safety belts, safety helmets, smoke detector, smoking near bedding or upholstery.   Dental health: Discussed importance of regular tooth brushing, flossing, and dental visits.    NEXT PREVENTATIVE PHYSICAL DUE IN 1 YEAR. Return in about 3 months (around 06/16/2022) for WEIGHT CHECK -- [id number].

## 2022-03-18 NOTE — Assessment & Plan Note (Addendum)
Ongoing and stable.  Noted on rheumatology labs.  Continue multivitamin and B12 at home.

## 2022-03-18 NOTE — Assessment & Plan Note (Signed)
Noted past labs, continue supplement and recheck level today. 

## 2022-03-19 LAB — CBC WITH DIFFERENTIAL/PLATELET
Basophils Absolute: 0 10*3/uL (ref 0.0–0.2)
Basos: 1 %
EOS (ABSOLUTE): 0 10*3/uL (ref 0.0–0.4)
Eos: 1 %
Hematocrit: 35.6 % (ref 34.0–46.6)
Hemoglobin: 11.1 g/dL (ref 11.1–15.9)
Immature Grans (Abs): 0 10*3/uL (ref 0.0–0.1)
Immature Granulocytes: 0 %
Lymphocytes Absolute: 1.5 10*3/uL (ref 0.7–3.1)
Lymphs: 24 %
MCH: 24.3 pg — ABNORMAL LOW (ref 26.6–33.0)
MCHC: 31.2 g/dL — ABNORMAL LOW (ref 31.5–35.7)
MCV: 78 fL — ABNORMAL LOW (ref 79–97)
Monocytes Absolute: 0.4 10*3/uL (ref 0.1–0.9)
Monocytes: 6 %
Neutrophils Absolute: 4.1 10*3/uL (ref 1.4–7.0)
Neutrophils: 68 %
Platelets: 213 10*3/uL (ref 150–450)
RBC: 4.56 x10E6/uL (ref 3.77–5.28)
RDW: 13.8 % (ref 11.7–15.4)
WBC: 6.1 10*3/uL (ref 3.4–10.8)

## 2022-03-19 LAB — COMPREHENSIVE METABOLIC PANEL
ALT: 14 IU/L (ref 0–32)
AST: 10 IU/L (ref 0–40)
Albumin/Globulin Ratio: 1.1 — ABNORMAL LOW (ref 1.2–2.2)
Albumin: 4 g/dL (ref 3.8–4.9)
Alkaline Phosphatase: 53 IU/L (ref 44–121)
BUN/Creatinine Ratio: 13 (ref 9–23)
BUN: 11 mg/dL (ref 6–24)
Bilirubin Total: 0.7 mg/dL (ref 0.0–1.2)
CO2: 24 mmol/L (ref 20–29)
Calcium: 9.1 mg/dL (ref 8.7–10.2)
Chloride: 99 mmol/L (ref 96–106)
Creatinine, Ser: 0.83 mg/dL (ref 0.57–1.00)
Globulin, Total: 3.7 g/dL (ref 1.5–4.5)
Glucose: 76 mg/dL (ref 70–99)
Potassium: 3.2 mmol/L — ABNORMAL LOW (ref 3.5–5.2)
Sodium: 139 mmol/L (ref 134–144)
Total Protein: 7.7 g/dL (ref 6.0–8.5)
eGFR: 85 mL/min/{1.73_m2} (ref >59)

## 2022-03-19 LAB — MAGNESIUM: Magnesium: 1.7 mg/dL (ref 1.6–2.3)

## 2022-03-19 LAB — IRON AND TIBC
Iron Saturation: 35 % (ref 15–55)
Iron: 124 ug/dL (ref 27–159)
Total Iron Binding Capacity: 359 ug/dL (ref 250–450)
UIBC: 235 ug/dL (ref 131–425)

## 2022-03-19 LAB — LIPID PANEL W/O CHOL/HDL RATIO
Cholesterol, Total: 198 mg/dL (ref 100–199)
HDL: 96 mg/dL (ref >39)
LDL Chol Calc (NIH): 86 mg/dL (ref 0–99)
Triglycerides: 92 mg/dL (ref 0–149)
VLDL Cholesterol Cal: 16 mg/dL (ref 5–40)

## 2022-03-19 LAB — VITAMIN D 25 HYDROXY (VIT D DEFICIENCY, FRACTURES): Vit D, 25-Hydroxy: 148 ng/mL — ABNORMAL HIGH (ref 30.0–100.0)

## 2022-03-19 LAB — TSH: TSH: 1.04 u[IU]/mL (ref 0.450–4.500)

## 2022-03-19 LAB — VITAMIN B12: Vitamin B-12: 690 pg/mL (ref 232–1245)

## 2022-03-19 LAB — FERRITIN: Ferritin: 47 ng/mL (ref 15–150)

## 2022-03-19 NOTE — Progress Notes (Signed)
Contacted via MyChart   Good morning Ruth Hanson, your labs have returned: - CBC shows no anemia or infection + iron level is normal. - Kidney function, creatinine and eGFR, remains normal, as is liver function, AST and ALT. Potassium level is on low side, we will recheck next visit.  I recommend you add potassium rich food to diet -- mangoes, bananas, avocados, nuts.  With you taking hydrochlorothiazide this can sometimes push potassium levels down, so we need to add a little in diet. - Vitamin D level is above goal.  Please stop weekly supplement and reduce Vitamin D3 to over the counter 1000 to 2000 units daily.   - Remainder of labs are stable.  Any questions? Keep being amazing!!  Thank you for allowing me to participate in your care.  I appreciate you. Kindest regards, Joron Velis

## 2022-03-20 ENCOUNTER — Other Ambulatory Visit: Payer: Self-pay | Admitting: Nurse Practitioner

## 2022-03-22 ENCOUNTER — Other Ambulatory Visit: Payer: Self-pay | Admitting: Nurse Practitioner

## 2022-03-22 NOTE — Telephone Encounter (Signed)
Requested medication (s) are due for refill today: yes  Requested medication (s) are on the active medication list: yes  Last refill:  03/20/22  Future visit scheduled: yes  Notes to clinic:   Alternative Requested:PA-REQ: MAX QUANITITY 3 EVERY 21 DAYS.      Requested Prescriptions  Pending Prescriptions Disp Refills   WEGOVY 2.4 MG/0.75ML SOAJ [Pharmacy Med Name: WEGOVY 2.4 MG/0.75 ML PEN]  5    Sig: Inject 2.4 mg into the skin once a week.     Endocrinology:  Diabetes - GLP-1 Receptor Agonists - semaglutide Failed - 03/20/2022 10:10 AM      Failed - HBA1C in normal range and within 180 days    Hgb A1c MFr Bld  Date Value Ref Range Status  12/31/2020 5.6 4.8 - 5.6 % Final    Comment:             Prediabetes: 5.7 - 6.4          Diabetes: >6.4          Glycemic control for adults with diabetes: <7.0          Passed - Cr in normal range and within 360 days    Creatinine, Ser  Date Value Ref Range Status  03/18/2022 0.83 0.57 - 1.00 mg/dL Final         Passed - Valid encounter within last 6 months    Recent Outpatient Visits           4 days ago Class 2 severe obesity due to excess calories with serious comorbidity and body mass index (BMI) of 37.0 to 37.9 in adult [facility])   [place], [name], NP   3 months ago Class 3 severe obesity due to excess calories with serious comorbidity and body mass index (BMI) of 40.0 to 44.9 in adult [facility])   [place], [name], NP   6 months ago Class 3 severe obesity due to excess calories with serious comorbidity and body mass index (BMI) of 40.0 to 44.9 in adult [facility])   [place], [name], NP   7 months ago Class 3 severe obesity due to excess calories with serious comorbidity and body mass index (BMI) of 45.0 to 49.9 in adult [facility])   [place], [name], NP   8 months ago Class 3 severe obesity due  to excess calories with serious comorbidity and body mass index (BMI) of 45.0 to 49.9 in adult [facility])   [place], [name], NP       Future Appointments             In 2 months [name], [name], NP [name], [name]

## 2022-03-23 DIAGNOSIS — M722 Plantar fascial fibromatosis: Secondary | ICD-10-CM | POA: Diagnosis not present

## 2022-03-23 NOTE — Telephone Encounter (Signed)
Requested medications are due for refill today.  See note  Requested medications are on the active medications list.  yes  Last refill. 03/18/2022 3 mL 5 rf  Future visit scheduled.   yes  Notes to clinic.  Returned from pharmacy. -   Pharmacy comment: Alternative Requested.     Requested Prescriptions  Pending Prescriptions Disp Refills   WEGOVY 2.4 MG/0.75ML SOAJ [Pharmacy Med Name: WEGOVY 2.4 MG/0.75 ML PEN]  5    Sig: Inject 2.4 mg into the skin once a week.     Endocrinology:  Diabetes - GLP-1 Receptor Agonists - semaglutide Failed - 03/22/2022  1:51 PM      Failed - HBA1C in normal range and within 180 days    Hgb A1c MFr Bld  Date Value Ref Range Status  12/31/2020 5.6 4.8 - 5.6 % Final    Comment:             Prediabetes: 5.7 - 6.4          Diabetes: >6.4          Glycemic control for adults with diabetes: <7.0          Passed - Cr in normal range and within 360 days    Creatinine, Ser  Date Value Ref Range Status  03/18/2022 0.83 0.57 - 1.00 mg/dL Final         Passed - Valid encounter within last 6 months    Recent Outpatient Visits           5 days ago Class 2 severe obesity due to excess calories with serious comorbidity and body mass index (BMI) of 37.0 to 37.9 in adult [facility])   [place], [name], NP   3 months ago Class 3 severe obesity due to excess calories with serious comorbidity and body mass index (BMI) of 40.0 to 44.9 in adult [facility])   [place], [name], NP   6 months ago Class 3 severe obesity due to excess calories with serious comorbidity and body mass index (BMI) of 40.0 to 44.9 in adult [facility])   [place], [name], NP   7 months ago Class 3 severe obesity due to excess calories with serious comorbidity and body mass index (BMI) of 45.0 to 49.9 in adult [facility])   [place], [name], NP   8 months ago Class  3 severe obesity due to excess calories with serious comorbidity and body mass index (BMI) of 45.0 to 49.9 in adult [facility])   [place], [name], NP       Future Appointments             In 2 months [name], [name], NP [place], [name]

## 2022-03-24 NOTE — Telephone Encounter (Signed)
Returned patients call and unable to leave message due to vm not being set up

## 2022-03-28 ENCOUNTER — Other Ambulatory Visit: Payer: Self-pay | Admitting: Nurse Practitioner

## 2022-03-29 NOTE — Telephone Encounter (Signed)
Rx written for 16 months- will RF Requested Prescriptions  Pending Prescriptions Disp Refills   omeprazole (PRILOSEC) 20 MG capsule [Pharmacy Med Name: OMEPRAZOLE DR 20 MG CAPSULE] 90 capsule 1    Sig: TAKE 1 CAPSULE BY MOUTH EVERY DAY     Gastroenterology: Proton Pump Inhibitors Passed - 03/28/2022  8:19 AM      Passed - Valid encounter within last 12 months    Recent Outpatient Visits           1 week ago Class 2 severe obesity due to excess calories with serious comorbidity and body mass index (BMI) of 37.0 to 37.9 in adult [facility])   [place], [name], NP   3 months ago Class 3 severe obesity due to excess calories with serious comorbidity and body mass index (BMI) of 40.0 to 44.9 in adult [facility])   [place], [name], NP   6 months ago Class 3 severe obesity due to excess calories with serious comorbidity and body mass index (BMI) of 40.0 to 44.9 in adult [facility])   [place], [name], NP   7 months ago Class 3 severe obesity due to excess calories with serious comorbidity and body mass index (BMI) of 45.0 to 49.9 in adult [facility])   [place], [name], NP   9 months ago Class 3 severe obesity due to excess calories with serious comorbidity and body mass index (BMI) of 45.0 to 49.9 in adult [facility])   [place], [name], NP       Future Appointments             In 2 months [name], [name], NP [place], [name]

## 2022-05-05 DIAGNOSIS — M722 Plantar fascial fibromatosis: Secondary | ICD-10-CM | POA: Diagnosis not present

## 2022-06-06 IMAGING — MG DIGITAL SCREENING BILAT W/ TOMO W/ CAD
6 of 12 series · 6 of 36 positions shown · non-contrast
Comparison: None

CLINICAL DATA: Screening. Baseline examination.

EXAM:
DIGITAL SCREENING BILATERAL MAMMOGRAM WITH TOMO AND CAD

[L MLO synth-2D (1 of 2)]
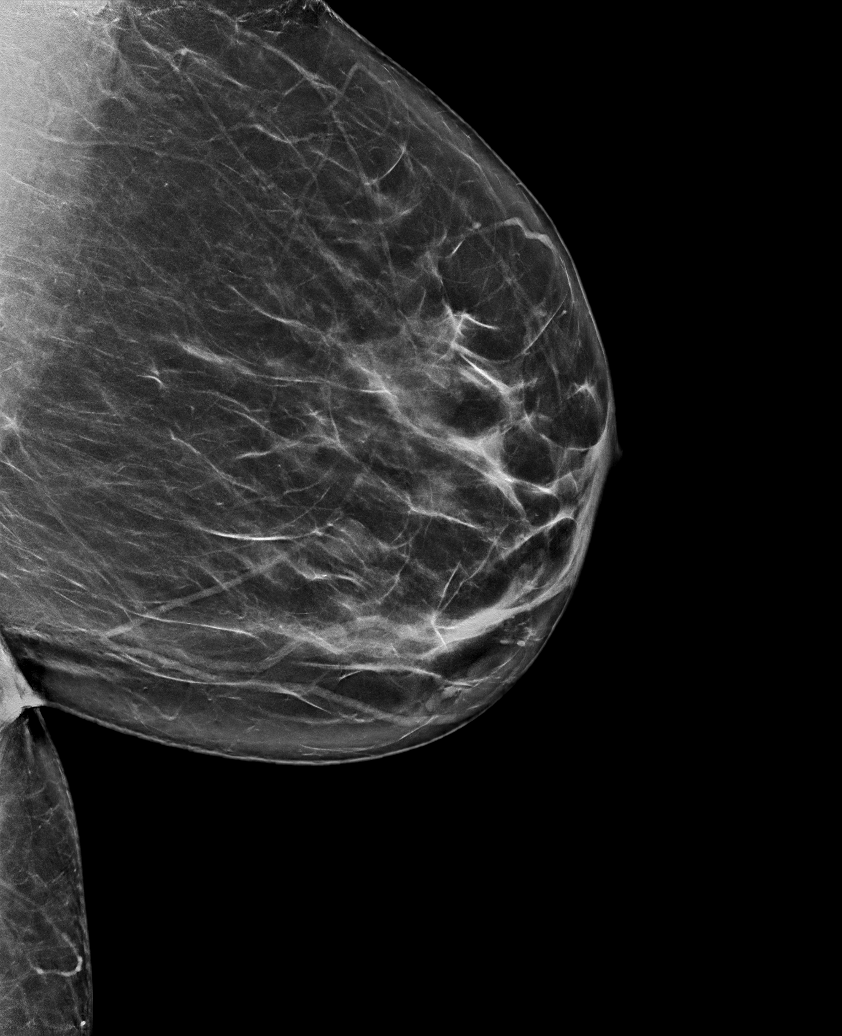

[L CC synth-2D]
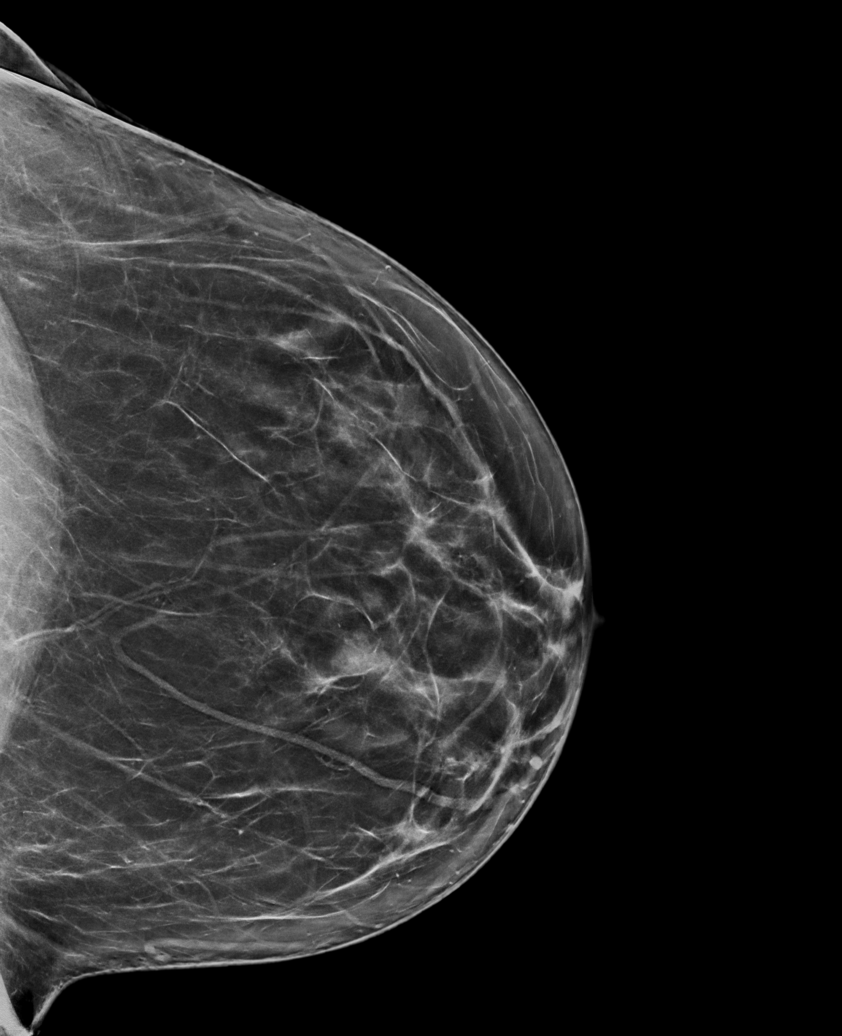

[R CC synth-2D]
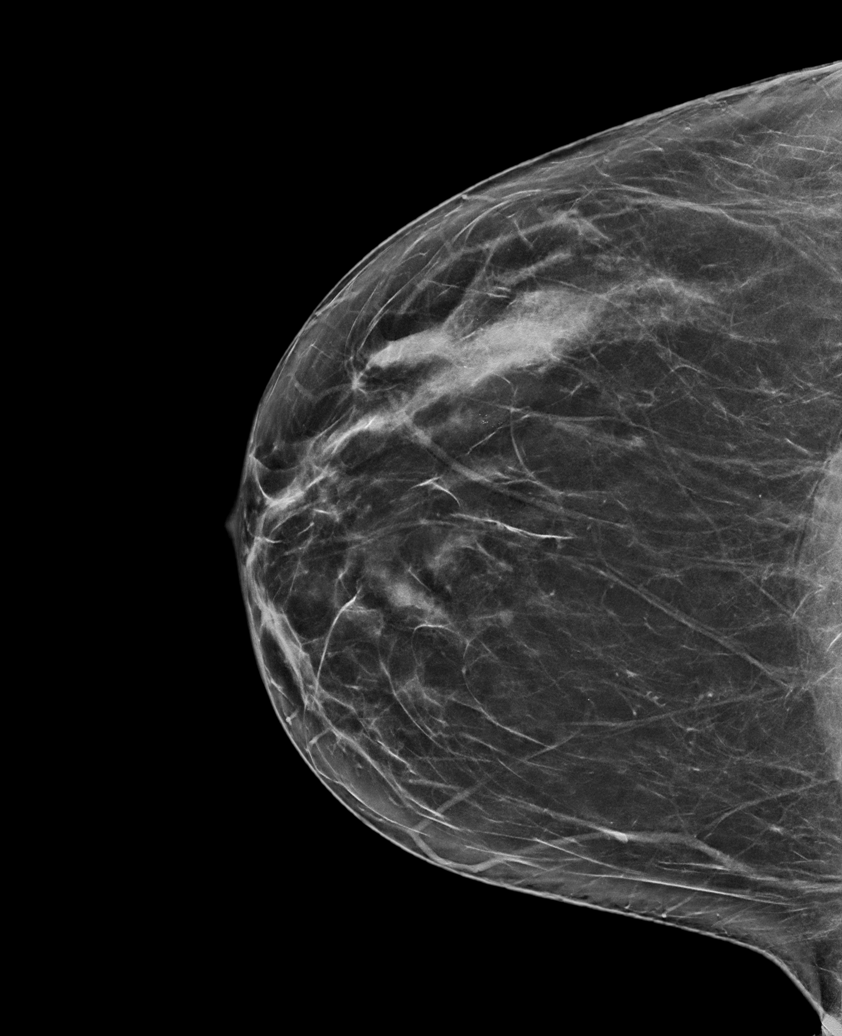

[R MLO synth-2D (1 of 2)]
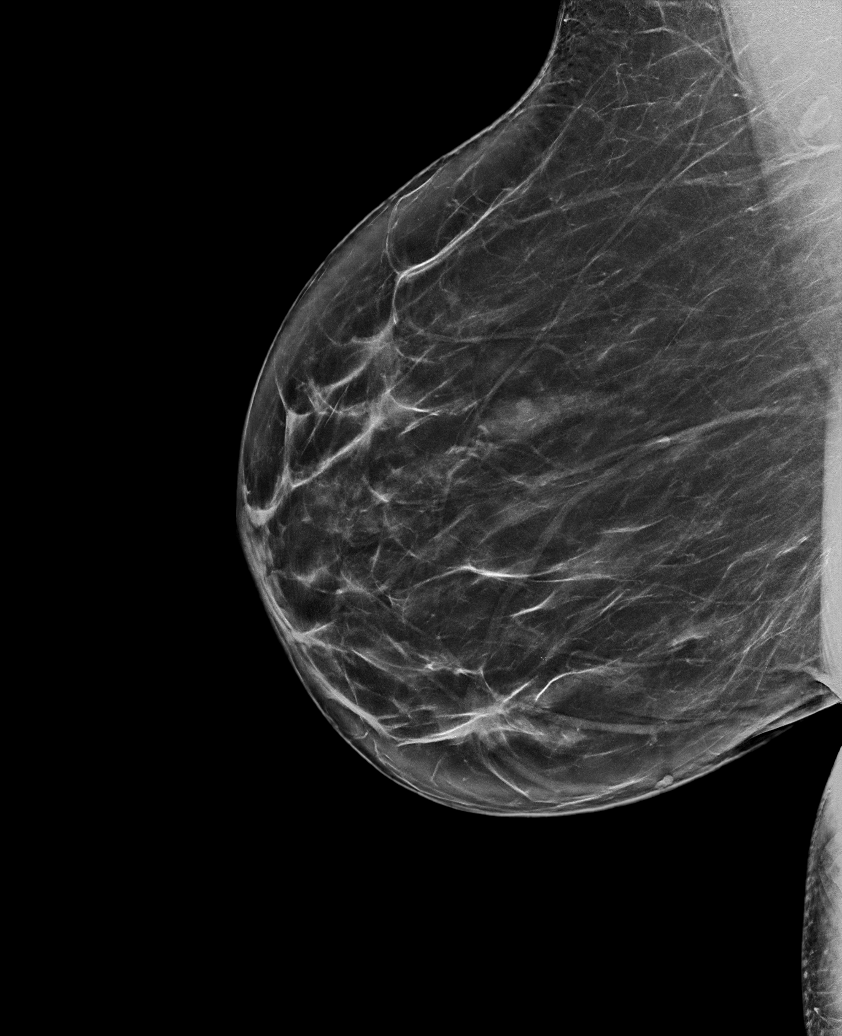

[L MLO synth-2D (2 of 2)]
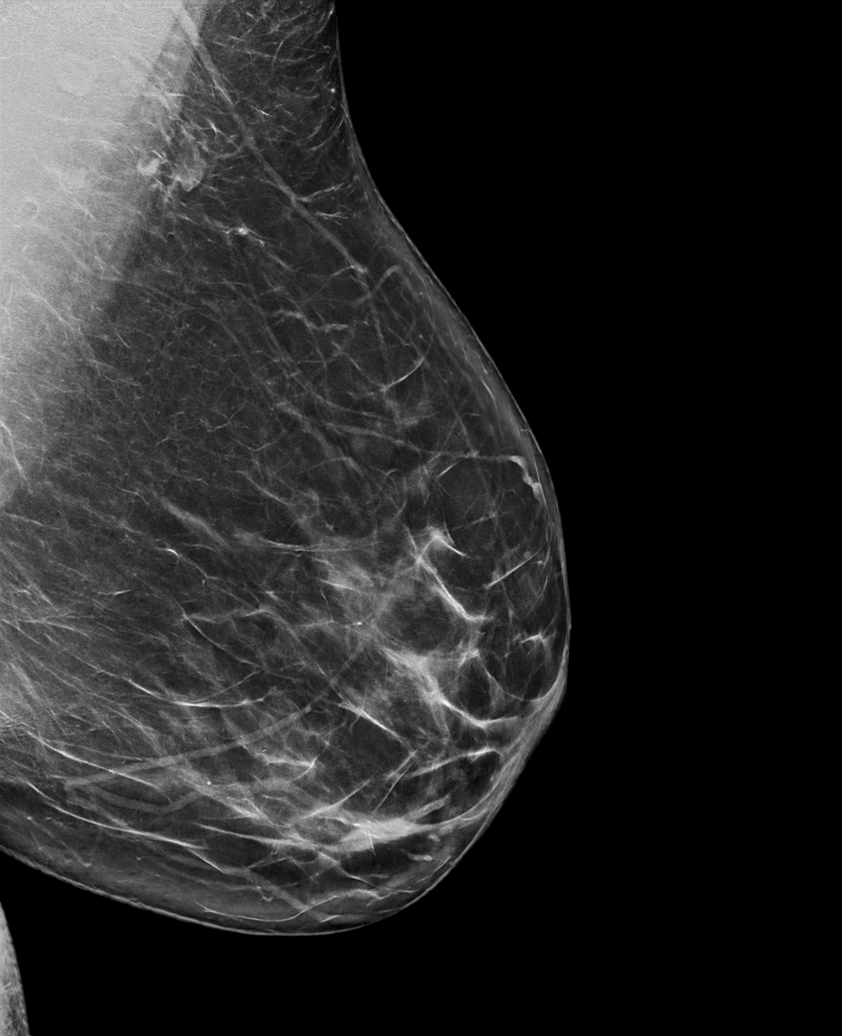

[R MLO synth-2D (2 of 2)]
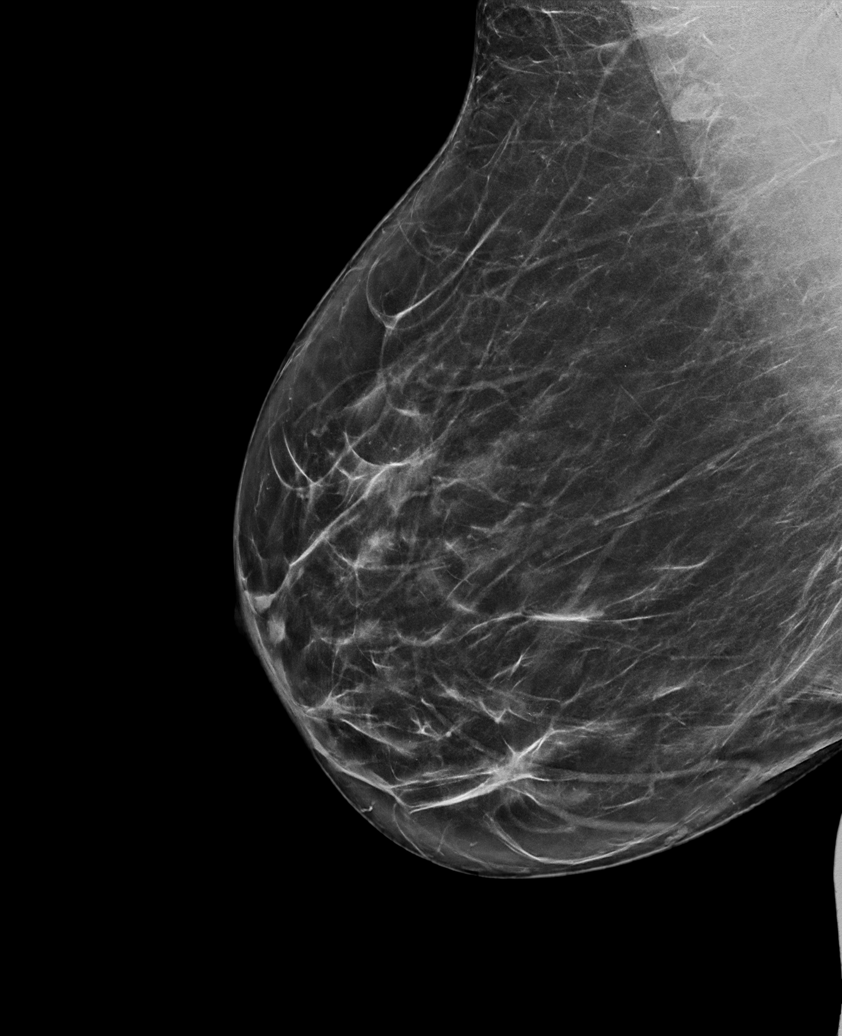

[6 of 36 positions shown; findings below may reference images not displayed]

ACR Breast Density Category b: There are scattered areas of
fibroglandular density.
FINDINGS: In the right breast LOWER OUTER RIGHT breast calcifications and a
possible OUTER breast asymmetry requires further evaluation.

In the left breast a possible mass requires further evaluation.

Images were processed with CAD.
IMPRESSION: Further evaluation is suggested for calcifications and possible
asymmetry in the right breast.

Further evaluation is suggested for possible mass in the left
breast.

RECOMMENDATION:
Diagnostic mammogram and possibly ultrasound of both breasts.
(Code:[id number])

The patient will be contacted regarding the findings, and additional
imaging will be scheduled.

BI-RADS CATEGORY  0: Incomplete. Need additional imaging evaluation
and/or prior mammograms for comparison.

## 2022-06-13 NOTE — Patient Instructions (Incomplete)
Jul 06 2022 08:45 AM - Office Visit [facility] - [place] Katz, May[place], MD   Healthy Eating, Adult Healthy eating may help you get and keep a healthy body weight, reduce the risk of chronic disease, and live a long and productive life. It is important to follow a healthy eating pattern. Your nutritional and calorie needs should be met mainly by different nutrient-rich foods. What are tips for following this plan? Reading food labels Read labels and choose the following: Reduced or low sodium products. Juices with 100% fruit juice. Foods with low saturated fats (<3 g per serving) and high polyunsaturated and monounsaturated fats. Foods with whole grains, such as whole wheat, cracked wheat, brown rice, and wild rice. Whole grains that are fortified with folic acid. This is recommended for females who are pregnant or who want to become pregnant. Read labels and do not eat or drink the following: Foods or drinks with added sugars. These include foods that contain brown sugar, corn sweetener, corn syrup, dextrose, fructose, glucose, high-fructose corn syrup, honey, invert sugar, lactose, malt syrup, maltose, molasses, raw sugar, sucrose, trehalose, or turbinado sugar. Limit your intake of added sugars to less than 10% of your total daily calories. Do not eat more than the following amounts of added sugar per day: 6 teaspoons (25 g) for females. 9 teaspoons (38 g) for males. Foods that contain processed or refined starches and grains. Refined grain products, such as white flour, degermed cornmeal, white bread, and white rice. Shopping Choose nutrient-rich snacks, such as vegetables, whole fruits, and nuts. Avoid high-calorie and high-sugar snacks, such as potato chips, fruit snacks, and candy. Use oil-based dressings and spreads on foods instead of solid fats such as butter, margarine, sour cream, or cream cheese. Limit pre-made sauces, mixes, and "instant" products such as flavored rice,  instant noodles, and ready-made pasta. Try more plant-protein sources, such as tofu, tempeh, black beans, edamame, lentils, nuts, and seeds. Explore eating plans such as the Mediterranean diet or vegetarian diet. Try heart-healthy dips made with beans and healthy fats like hummus and guacamole. Vegetables go great with these. Cooking Use oil to saut or stir-fry foods instead of solid fats such as butter, margarine, or lard. Try baking, boiling, grilling, or broiling instead of frying. Remove the fatty part of meats before cooking. Steam vegetables in water or broth. Meal planning  At meals, imagine dividing your plate into fourths: One-half of your plate is fruits and vegetables. One-fourth of your plate is whole grains. One-fourth of your plate is protein, especially lean meats, poultry, eggs, tofu, beans, or nuts. Include low-fat dairy as part of your daily diet. Lifestyle Choose healthy options in all settings, including home, work, school, restaurants, or stores. Prepare your food safely: Wash your hands after handling raw meats. Where you prepare food, keep surfaces clean by regularly washing with hot, soapy water. Keep raw meats separate from ready-to-eat foods, such as fruits and vegetables. Cook seafood, meat, poultry, and eggs to the recommended temperature. Get a food thermometer. Store foods at safe temperatures. In general: Keep cold foods at 50F (4.4C) or below. Keep hot foods at 150F (60C) or above. Keep your freezer at [facility] (-17.8C) or below. Foods are not safe to eat if they have been between the temperatures of 40-150F (4.4-60C) for more than 2 hours. What foods should I eat? Fruits Aim to eat 1-2 cups of fresh, canned (in natural juice), or frozen fruits each day. One cup of fruit equals 1 small apple, 1  large banana, 8 large strawberries, 1 cup (237 g) canned fruit,  cup (82 g) dried fruit, or 1 cup (240 mL) 100% juice. Vegetables Aim to eat 2-4 cups of  fresh and frozen vegetables each day, including different varieties and colors. One cup of vegetables equals 1 cup (91 g) broccoli or cauliflower florets, 2 medium carrots, 2 cups (150 g) raw, leafy greens, 1 large tomato, 1 large bell pepper, 1 large sweet potato, or 1 medium white potato. Grains Aim to eat 5-10 ounce-equivalents of whole grains each day. Examples of 1 ounce-equivalent of grains include 1 slice of bread, 1 cup (40 g) ready-to-eat cereal, 3 cups (24 g) popcorn, or  cup (93 g) cooked rice. Meats and other proteins Try to eat 5-7 ounce-equivalents of protein each day. Examples of 1 ounce-equivalent of protein include 1 egg,  oz nuts (12 almonds, 24 pistachios, or 7 walnut halves), 1/4 cup (90 g) cooked beans, 6 tablespoons (90 g) hummus or 1 tablespoon (16 g) peanut butter. A cut of meat or fish that is the size of a deck of cards is about 3-4 ounce-equivalents (85 g). Of the protein you eat each week, try to have at least 8 sounce (227 g) of seafood. This is about 2 servings per week. This includes salmon, trout, herring, sardines, and anchovies. Dairy Aim to eat 3 cup-equivalents of fat-free or low-fat dairy each day. Examples of 1 cup-equivalent of dairy include 1 cup (240 mL) milk, 8 ounces (250 g) yogurt, 1 ounces (44 g) natural cheese, or 1 cup (240 mL) fortified soy milk. Fats and oils Aim for about 5 teaspoons (21 g) of fats and oils per day. Choose monounsaturated fats, such as canola and olive oils, mayonnaise made with olive oil or avocado oil, avocados, peanut butter, and most nuts, or polyunsaturated fats, such as sunflower, corn, and soybean oils, walnuts, pine nuts, sesame seeds, sunflower seeds, and flaxseed. Beverages Aim for 6 eight-ounce glasses of water per day. Limit coffee to 3-5 eight-ounce cups per day. Limit caffeinated beverages that have added calories, such as soda and energy drinks. If you drink alcohol: Limit how much you have to: 0-1 drink a day if you  are female. 0-2 drinks a day if you are female. Know how much alcohol is in your drink. In the [place] one drink is one 12 oz bottle of beer (355 mL), one 5 oz glass of wine (148 mL), or one 1 oz glass of hard liquor (44 mL). Seasoning and other foods Try not to add too much salt to your food. Try using herbs and spices instead of salt. Try not to add sugar to food. This information is based on [place] nutrition guidelines. To learn more, visit DisposableNylon.be. Exact amounts may vary. You may need different amounts. This information is not intended to replace advice given to you by your health care provider. Make sure you discuss any questions you have with your health care provider. Document Revised: 10/26/2021 Document Reviewed: 10/26/2021 Elsevier Patient Education  2023 [employer].

## 2022-06-16 ENCOUNTER — Ambulatory Visit: Payer: BC Managed Care – PPO | Admitting: Nurse Practitioner

## 2022-06-16 ENCOUNTER — Encounter: Payer: Self-pay | Admitting: Nurse Practitioner

## 2022-06-16 VITALS — BP 104/74 | HR 83 | Temp 98.1°F | Ht 65.98 in | Wt 222.8 lb

## 2022-06-16 DIAGNOSIS — I10 Essential (primary) hypertension: Secondary | ICD-10-CM | POA: Diagnosis not present

## 2022-06-16 DIAGNOSIS — Z6835 Body mass index (BMI) 35.0-35.9, adult: Secondary | ICD-10-CM

## 2022-06-16 DIAGNOSIS — E559 Vitamin D deficiency, unspecified: Secondary | ICD-10-CM | POA: Diagnosis not present

## 2022-06-16 MED ORDER — WEGOVY 2.4 MG/0.75ML ~~LOC~~ SOAJ: 2.4000 mg | SUBCUTANEOUS | 5 refills | Status: DC

## 2022-06-16 NOTE — Assessment & Plan Note (Signed)
Chronic, stable.  BP at goal today in office.  Recommend she monitor BP at least a few mornings a week at home and document.  DASH diet at home.  Continue current medication regimen and adjust as needed.  Labs: BMP to recheck K+ level.  Refills up to date.

## 2022-06-16 NOTE — Assessment & Plan Note (Signed)
Chronic, ongoing.  Noted past labs, continue supplement on reduced level due to recent high Vitamin D level and recheck today.

## 2022-06-16 NOTE — Assessment & Plan Note (Signed)
BMI 35.98,  69 lbs loss total at this time with Wegovy (started 03/11/21), is tolerating and seeing appetite changes + increased energy levels. Will continue Wegovy, 2.4 MG weekly, discussed with patient at length -- will perform PA if needed.  Recommended eating smaller high protein, low fat meals more frequently and exercising 30 mins a day 5 times a week with a goal of 10-15lb weight loss in the next 3 months. Patient voiced their understanding and motivation to adhere to these recommendations. Return in 3 months.

## 2022-06-16 NOTE — Progress Notes (Signed)
BP 104/74   Pulse 83   Temp 98.1 F (36.7 C) (Oral)   Ht 5' 5.98" (1.676 m)   Wt 222 lb 12.8 oz (101.1 kg)   SpO2 98%   BMI 35.98 kg/m    Subjective:    Patient ID: Ruth Hanson, female    DOB: 30-Oct-1970, 52 y.o.   MRN: [id number]  HPI: Ruth TONOA VARS is a 52 y.o. female  Chief Complaint  Patient presents with   Weight Management Screening   OBESITY: She was started on Wegovy on 03/11/21, currently on 2.4 MG dosing -- has been able to get dosing from pharmacy consistently so far.   Has lost total of 69 pounds at this time, from 291 lbs to 222 lbs -- 11 pounds lost since last visit.  Reports ongoing decrease in appetite with this and can not eat larger meals, which she has found is beneficial. Is working on healthier diet -- does endorse cost of healthy food is an issue. Continues to eat reduced portions at meals. Continues to work on exercise regimen -- wishes to work on this more.   Feels more energetic with medication on board.  Reports self esteem improved.    Recent labs did not mildly low K+ at 3.2, she has been working on diet changes.  Vitamin D level was 148 and recommended she reduce dosing supplement.  Relevant past medical, surgical, family and social history reviewed and updated as indicated. Interim medical history since our last visit reviewed. Allergies and medications reviewed and updated.  Review of Systems  Constitutional:  Negative for activity change, appetite change, diaphoresis, fatigue and fever.  HENT: Negative.    Respiratory:  Negative for cough, chest tightness, shortness of breath and wheezing.   Cardiovascular:  Negative for chest pain, palpitations and leg swelling.  Gastrointestinal: Negative.   Neurological: Negative.   Psychiatric/Behavioral: Negative.      Per HPI unless specifically indicated above     Objective:    BP 104/74   Pulse 83   Temp 98.1 F (36.7 C) (Oral)   Ht 5' 5.98" (1.676 m)   Wt 222 lb 12.8 oz (101.1 kg)    SpO2 98%   BMI 35.98 kg/m   Wt Readings from Last 3 Encounters:  06/16/22 222 lb 12.8 oz (101.1 kg)  03/18/22 233 lb 8 oz (105.9 kg)  12/16/21 252 lb 8 oz (114.5 kg)    Physical Exam Vitals and nursing note reviewed.  Constitutional:      General: She is awake. She is not in acute distress.    Appearance: She is well-developed and well-groomed. She is obese. She is not ill-appearing or toxic-appearing.  HENT:     Head: Normocephalic.     Right Ear: Hearing normal.     Left Ear: Hearing normal.  Eyes:     General: Lids are normal.        Right eye: No discharge.        Left eye: No discharge.     Conjunctiva/sclera: Conjunctivae normal.     Pupils: Pupils are equal, round, and reactive to light.  Neck:     Thyroid: No thyromegaly.     Vascular: No carotid bruit.  Cardiovascular:     Rate and Rhythm: Normal rate and regular rhythm.     Heart sounds: Normal heart sounds. No murmur heard.    No gallop.  Pulmonary:     Effort: Pulmonary effort is normal. No accessory muscle usage  or respiratory distress.     Breath sounds: Normal breath sounds.  Abdominal:     General: Bowel sounds are normal. There is no distension.     Palpations: Abdomen is soft.     Tenderness: There is no abdominal tenderness.     Hernia: No hernia is present.  Musculoskeletal:     Cervical back: Normal range of motion and neck supple.     Lumbar back: Normal.     Right lower leg: No edema.     Left lower leg: No edema.  Lymphadenopathy:     Cervical: No cervical adenopathy.  Skin:    General: Skin is warm and dry.  Neurological:     Mental Status: She is alert and oriented to person, place, and time.  Psychiatric:        Attention and Perception: Attention normal.        Mood and Affect: Mood normal.        Behavior: Behavior normal. Behavior is cooperative.        Thought Content: Thought content normal.        Judgment: Judgment normal.    Results for orders placed or performed in visit on  03/18/22  CBC with Differential/Platelet  Result Value Ref Range   WBC 6.1 3.4 - 10.8 x10E3/uL   RBC 4.56 3.77 - 5.28 x10E6/uL   Hemoglobin 11.1 11.1 - 15.9 g/dL   Hematocrit 16.1 09.6 - 46.6 %   MCV 78 (L) 79 - 97 fL   MCH 24.3 (L) 26.6 - 33.0 pg   MCHC 31.2 (L) 31.5 - 35.7 g/dL   RDW 04.5 40.9 - 81.1 %   Platelets 213 150 - 450 x10E3/uL   Neutrophils 68 Not Estab. %   Lymphs 24 Not Estab. %   Monocytes 6 Not Estab. %   Eos 1 Not Estab. %   Basos 1 Not Estab. %   Neutrophils Absolute 4.1 1.4 - 7.0 x10E3/uL   Lymphocytes Absolute 1.5 0.7 - 3.1 x10E3/uL   Monocytes Absolute 0.4 0.1 - 0.9 x10E3/uL   EOS (ABSOLUTE) 0.0 0.0 - 0.4 x10E3/uL   Basophils Absolute 0.0 0.0 - 0.2 x10E3/uL   Immature Granulocytes 0 Not Estab. %   Immature Grans (Abs) 0.0 0.0 - 0.1 x10E3/uL  Comprehensive metabolic panel  Result Value Ref Range   Glucose 76 70 - 99 mg/dL   BUN 11 6 - 24 mg/dL   Creatinine, Ser 9.14 0.57 - 1.00 mg/dL   eGFR 85 >78 GN/FAO/1.30   BUN/Creatinine Ratio 13 9 - 23   Sodium 139 134 - 144 mmol/L   Potassium 3.2 (L) 3.5 - 5.2 mmol/L   Chloride 99 96 - 106 mmol/L   CO2 24 20 - 29 mmol/L   Calcium 9.1 8.7 - 10.2 mg/dL   Total Protein 7.7 6.0 - 8.5 g/dL   Albumin 4.0 3.8 - 4.9 g/dL   Globulin, Total 3.7 1.5 - 4.5 g/dL   Albumin/Globulin Ratio 1.1 (L) 1.2 - 2.2   Bilirubin Total 0.7 0.0 - 1.2 mg/dL   Alkaline Phosphatase 53 44 - 121 IU/L   AST 10 0 - 40 IU/L   ALT 14 0 - 32 IU/L  Ferritin  Result Value Ref Range   Ferritin 47 15 - 150 ng/mL  Iron Binding Cap (TIBC)([facility])  Result Value Ref Range   Total Iron Binding Capacity 359 250 - 450 ug/dL   UIBC 865 784 - 696 ug/dL   Iron 295 27 - 284 ug/dL  Iron Saturation 35 15 - 55 %  Lipid Panel w/o Chol/HDL Ratio  Result Value Ref Range   Cholesterol, Total 198 100 - 199 mg/dL   Triglycerides 92 0 - 149 mg/dL   HDL 96 >16 mg/dL   VLDL Cholesterol Cal 16 5 - 40 mg/dL   LDL Chol Calc (NIH) 86 0 - 99 mg/dL   Magnesium  Result Value Ref Range   Magnesium 1.7 1.6 - 2.3 mg/dL  TSH  Result Value Ref Range   TSH 1.040 0.450 - 4.500 uIU/mL  Vitamin B12  Result Value Ref Range   Vitamin B-12 690 232 - 1,245 pg/mL  VITAMIN D 25 Hydroxy (Vit-D Deficiency, Fractures)  Result Value Ref Range   Vit D, 25-Hydroxy 148.0 (H) 30.0 - 100.0 ng/mL      Assessment & Plan:   Problem List Items Addressed This Visit       Cardiovascular and Mediastinum   Essential hypertension    Chronic, stable.  BP at goal today in office.  Recommend she monitor BP at least a few mornings a week at home and document.  DASH diet at home.  Continue current medication regimen and adjust as needed.  Labs: BMP to recheck K+ level.  Refills up to date.        Relevant Orders   Basic metabolic panel     Other   Obesity - Primary    BMI 35.98,  69 lbs loss total at this time with Wegovy (started 03/11/21), is tolerating and seeing appetite changes + increased energy levels. Will continue Wegovy, 2.4 MG weekly, discussed with patient at length -- will perform PA if needed.  Recommended eating smaller high protein, low fat meals more frequently and exercising 30 mins a day 5 times a week with a goal of 10-15lb weight loss in the next 3 months. Patient voiced their understanding and motivation to adhere to these recommendations. Return in 3 months.       Relevant Medications   Semaglutide-Weight Management (WEGOVY) 2.4 MG/0.75ML SOAJ   Vitamin D deficiency    Chronic, ongoing.  Noted past labs, continue supplement on reduced level due to recent high Vitamin D level and recheck today.      Relevant Orders   VITAMIN D 25 Hydroxy (Vit-D Deficiency, Fractures)     Follow up plan: Return in about 3 months (around 09/16/2022) for WEIGHT CHECK -- XWRUEA.

## 2022-06-17 ENCOUNTER — Other Ambulatory Visit: Payer: Self-pay | Admitting: Nurse Practitioner

## 2022-06-17 DIAGNOSIS — E876 Hypokalemia: Secondary | ICD-10-CM

## 2022-06-17 LAB — BASIC METABOLIC PANEL
BUN/Creatinine Ratio: 11 (ref 9–23)
BUN: 10 mg/dL (ref 6–24)
CO2: 25 mmol/L (ref 20–29)
Calcium: 9.4 mg/dL (ref 8.7–10.2)
Chloride: 98 mmol/L (ref 96–106)
Creatinine, Ser: 0.89 mg/dL (ref 0.57–1.00)
Glucose: 81 mg/dL (ref 70–99)
Potassium: 3 mmol/L — ABNORMAL LOW (ref 3.5–5.2)
Sodium: 138 mmol/L (ref 134–144)
eGFR: 78 mL/min/{1.73_m2} (ref >59)

## 2022-06-17 LAB — VITAMIN D 25 HYDROXY (VIT D DEFICIENCY, FRACTURES): Vit D, 25-Hydroxy: 197.2 ng/mL — ABNORMAL HIGH (ref 30.0–100.0)

## 2022-06-17 MED ORDER — POTASSIUM CHLORIDE CRYS ER 20 MEQ PO TBCR: 20.0000 meq | EXTENDED_RELEASE_TABLET | Freq: Every day | ORAL | 0 refills | Status: DC

## 2022-06-17 NOTE — Progress Notes (Signed)
Contacted via MyChart -- needs lab only visit in 1 week please outpatient    Good evening [place]ita, your labs have returned: - Vitamin D level remains too high.  Please stop weekly dosing if taking and hold off on restarting any supplement with Vitamin D until your next visit with me and a recheck. - Potassium level is low, lower then last visit.  Hydrochlorothiazide can at times lower levels on this.  I will send in a supplement to take once daily and would like you to return next week for lab only visit to recheck this.  Also increase potassium rich food in diet like bananas, mangoes, leafy greens, nuts, dried fruit.  Any questions on this? Keep being amazing!!  Thank you for allowing me to participate in your care.  I appreciate you. Kindest regards, Jayd Forrey

## 2022-06-18 ENCOUNTER — Telehealth: Payer: Self-pay | Admitting: Nurse Practitioner

## 2022-06-18 NOTE — Telephone Encounter (Signed)
Copied from CRM [id number]. Topic: General - Other >> Jun 18, 2022  9:44 AM [place] B wrote: Reason for CRM: pt called in about a med she says Dr Harvest Dark was gonna to put her on, she inst sure if it was for her potassium . Please cb to discuss

## 2022-06-18 NOTE — Telephone Encounter (Signed)
Called patient to discuss lab results.  No answer. HIPAA compliant vm left requesting return call.  Ok for nurse triage to give results if patient calls back.    

## 2022-06-21 ENCOUNTER — Encounter: Payer: Self-pay | Admitting: Nurse Practitioner

## 2022-06-21 ENCOUNTER — Other Ambulatory Visit: Payer: BC Managed Care – PPO

## 2022-06-21 DIAGNOSIS — E876 Hypokalemia: Secondary | ICD-10-CM

## 2022-06-21 DIAGNOSIS — N912 Amenorrhea, unspecified: Secondary | ICD-10-CM

## 2022-06-22 LAB — BASIC METABOLIC PANEL
BUN/Creatinine Ratio: 11 (ref 9–23)
BUN: 11 mg/dL (ref 6–24)
CO2: 26 mmol/L (ref 20–29)
Calcium: 9.4 mg/dL (ref 8.7–10.2)
Chloride: 99 mmol/L (ref 96–106)
Creatinine, Ser: 0.96 mg/dL (ref 0.57–1.00)
Glucose: 80 mg/dL (ref 70–99)
Potassium: 3.7 mmol/L (ref 3.5–5.2)
Sodium: 137 mmol/L (ref 134–144)
eGFR: 71 mL/min/{1.73_m2} (ref >59)

## 2022-06-22 NOTE — Progress Notes (Signed)
Contacted via MyChart   Good morning [place], your potassium level improved with supplement.  I recommend heavy focus on potassium in diet for now, ensuring to eat lots of potassium rich foods -- there is a plethora of information online on these types of foods.  Goal is to be able to maintain hydrochlorothiazide, but if BP stable and K+ low in future we may need to stop this.  Any questions? Keep being amazing!!  Thank you for allowing me to participate in your care.  I appreciate you. Kindest regards, Mackenzye Mackel

## 2022-06-24 ENCOUNTER — Other Ambulatory Visit: Payer: Self-pay | Admitting: Nurse Practitioner

## 2022-06-24 NOTE — Telephone Encounter (Signed)
Requested Prescriptions  Pending Prescriptions Disp Refills   omeprazole (PRILOSEC) 20 MG capsule [Pharmacy Med Name: OMEPRAZOLE DR 20 MG CAPSULE] 90 capsule 0    Sig: TAKE 1 CAPSULE BY MOUTH EVERY DAY     Gastroenterology: Proton Pump Inhibitors Passed - 06/24/2022  2:40 AM      Passed - Valid encounter within last 12 months    Recent Outpatient Visits           1 week ago Class 2 severe obesity due to excess calories with serious comorbidity and body mass index (BMI) of 35.0 to 35.9 in adult [facility])   [place] [facility] [place], [name], NP   3 months ago Class 2 severe obesity due to excess calories with serious comorbidity and body mass index (BMI) of 37.0 to 37.9 in adult [facility])   [place] [facility] [place], [name], NP   6 months ago Class 3 severe obesity due to excess calories with serious comorbidity and body mass index (BMI) of 40.0 to 44.9 in adult [facility])   [place] [facility] [place], [name], NP   9 months ago Class 3 severe obesity due to excess calories with serious comorbidity and body mass index (BMI) of 40.0 to 44.9 in adult [facility])   [place] [facility] [place], [name], NP   10 months ago Class 3 severe obesity due to excess calories with serious comorbidity and body mass index (BMI) of 45.0 to 49.9 in adult [facility])   [place] [facility] [place], Dorie Rank, NP       Future Appointments             In 2 months [name], Dorie Rank, NP [facility], [name]

## 2022-07-06 DIAGNOSIS — M7062 Trochanteric bursitis, left hip: Secondary | ICD-10-CM | POA: Diagnosis not present

## 2022-07-06 DIAGNOSIS — M5136 Other intervertebral disc degeneration, lumbar region: Secondary | ICD-10-CM | POA: Diagnosis not present

## 2022-07-06 DIAGNOSIS — R768 Other specified abnormal immunological findings in serum: Secondary | ICD-10-CM | POA: Diagnosis not present

## 2022-08-17 ENCOUNTER — Encounter: Payer: Self-pay | Admitting: Nurse Practitioner

## 2022-09-09 NOTE — Patient Instructions (Signed)
Be Involved in Caring For Your Health:  Taking Medications When medications are taken as directed, they can greatly improve your health. But if they are not taken as prescribed, they may not work. In some cases, not taking them correctly can be harmful. To help ensure your treatment remains effective and safe, understand your medications and how to take them. Bring your medications to each visit for review by your provider.  Your lab results, notes, and after visit summary will be available on My Chart. We strongly encourage you to use this feature. If lab results are abnormal the clinic will contact you with the appropriate steps. If the clinic does not contact you assume the results are satisfactory. You can always view your results on My Chart. If you have questions regarding your health or results, please contact the clinic during office hours. You can also ask questions on My Chart.  We at [facility] are grateful that you chose [place] to provide your care. We strive to provide evidence-based and compassionate care and are always looking for feedback. If you get a survey from the clinic please complete this so we can hear your opinions.  Healthy Eating, Adult Healthy eating may help you get and keep a healthy body weight, reduce the risk of chronic disease, and live a long and productive life. It is important to follow a healthy eating pattern. Your nutritional and calorie needs should be met mainly by different nutrient-rich foods. What are tips for following this plan? Reading food labels Read labels and choose the following: Reduced or low sodium products. Juices with 100% fruit juice. Foods with low saturated fats (<3 g per serving) and high polyunsaturated and monounsaturated fats. Foods with whole grains, such as whole wheat, cracked wheat, brown rice, and wild rice. Whole grains that are fortified with folic acid. This is recommended for females who are pregnant or who want to  become pregnant. Read labels and do not eat or drink the following: Foods or drinks with added sugars. These include foods that contain brown sugar, corn sweetener, corn syrup, dextrose, fructose, glucose, high-fructose corn syrup, honey, invert sugar, lactose, malt syrup, maltose, molasses, raw sugar, sucrose, trehalose, or turbinado sugar. Limit your intake of added sugars to less than 10% of your total daily calories. Do not eat more than the following amounts of added sugar per day: 6 teaspoons (25 g) for females. 9 teaspoons (38 g) for males. Foods that contain processed or refined starches and grains. Refined grain products, such as white flour, degermed cornmeal, white bread, and white rice. Shopping Choose nutrient-rich snacks, such as vegetables, whole fruits, and nuts. Avoid high-calorie and high-sugar snacks, such as potato chips, fruit snacks, and candy. Use oil-based dressings and spreads on foods instead of solid fats such as butter, margarine, sour cream, or cream cheese. Limit pre-made sauces, mixes, and "instant" products such as flavored rice, instant noodles, and ready-made pasta. Try more plant-protein sources, such as tofu, tempeh, black beans, edamame, lentils, nuts, and seeds. Explore eating plans such as the Mediterranean diet or vegetarian diet. Try heart-healthy dips made with beans and healthy fats like hummus and guacamole. Vegetables go great with these. Cooking Use oil to saut or stir-fry foods instead of solid fats such as butter, margarine, or lard. Try baking, boiling, grilling, or broiling instead of frying. Remove the fatty part of meats before cooking. Steam vegetables in water or broth. Meal planning  At meals, imagine dividing your plate into fourths: One-half of  your plate is fruits and vegetables. One-fourth of your plate is whole grains. One-fourth of your plate is protein, especially lean meats, poultry, eggs, tofu, beans, or nuts. Include low-fat  dairy as part of your daily diet. Lifestyle Choose healthy options in all settings, including home, work, school, restaurants, or stores. Prepare your food safely: Wash your hands after handling raw meats. Where you prepare food, keep surfaces clean by regularly washing with hot, soapy water. Keep raw meats separate from ready-to-eat foods, such as fruits and vegetables. Cook seafood, meat, poultry, and eggs to the recommended temperature. Get a food thermometer. Store foods at safe temperatures. In general: Keep cold foods at 48F (4.4C) or below. Keep hot foods at 148F (60C) or above. Keep your freezer at [facility] (-17.8C) or below. Foods are not safe to eat if they have been between the temperatures of 40-148F (4.4-60C) for more than 2 hours. What foods should I eat? Fruits Aim to eat 1-2 cups of fresh, canned (in natural juice), or frozen fruits each day. One cup of fruit equals 1 small apple, 1 large banana, 8 large strawberries, 1 cup (237 g) canned fruit,  cup (82 g) dried fruit, or 1 cup (240 mL) 100% juice. Vegetables Aim to eat 2-4 cups of fresh and frozen vegetables each day, including different varieties and colors. One cup of vegetables equals 1 cup (91 g) broccoli or cauliflower florets, 2 medium carrots, 2 cups (150 g) raw, leafy greens, 1 large tomato, 1 large bell pepper, 1 large sweet potato, or 1 medium white potato. Grains Aim to eat 5-10 ounce-equivalents of whole grains each day. Examples of 1 ounce-equivalent of grains include 1 slice of bread, 1 cup (40 g) ready-to-eat cereal, 3 cups (24 g) popcorn, or  cup (93 g) cooked rice. Meats and other proteins Try to eat 5-7 ounce-equivalents of protein each day. Examples of 1 ounce-equivalent of protein include 1 egg,  oz nuts (12 almonds, 24 pistachios, or 7 walnut halves), 1/4 cup (90 g) cooked beans, 6 tablespoons (90 g) hummus or 1 tablespoon (16 g) peanut butter. A cut of meat or fish that is the size of a deck of  cards is about 3-4 ounce-equivalents (85 g). Of the protein you eat each week, try to have at least 8 sounce (227 g) of seafood. This is about 2 servings per week. This includes salmon, trout, herring, sardines, and anchovies. Dairy Aim to eat 3 cup-equivalents of fat-free or low-fat dairy each day. Examples of 1 cup-equivalent of dairy include 1 cup (240 mL) milk, 8 ounces (250 g) yogurt, 1 ounces (44 g) natural cheese, or 1 cup (240 mL) fortified soy milk. Fats and oils Aim for about 5 teaspoons (21 g) of fats and oils per day. Choose monounsaturated fats, such as canola and olive oils, mayonnaise made with olive oil or avocado oil, avocados, peanut butter, and most nuts, or polyunsaturated fats, such as sunflower, corn, and soybean oils, walnuts, pine nuts, sesame seeds, sunflower seeds, and flaxseed. Beverages Aim for 6 eight-ounce glasses of water per day. Limit coffee to 3-5 eight-ounce cups per day. Limit caffeinated beverages that have added calories, such as soda and energy drinks. If you drink alcohol: Limit how much you have to: 0-1 drink a day if you are female. 0-2 drinks a day if you are female. Know how much alcohol is in your drink. In the [place] one drink is one 12 oz bottle of beer (355 mL), one 5 oz glass of wine (  148 mL), or one 1 oz glass of hard liquor (44 mL). Seasoning and other foods Try not to add too much salt to your food. Try using herbs and spices instead of salt. Try not to add sugar to food. This information is based on [place] nutrition guidelines. To learn more, visit DisposableNylon.be. Exact amounts may vary. You may need different amounts. This information is not intended to replace advice given to you by your health care provider. Make sure you discuss any questions you have with your health care provider. Document Revised: 10/26/2021 Document Reviewed: 10/26/2021 Elsevier Patient Education  2024 [employer].

## 2022-09-17 ENCOUNTER — Encounter: Payer: Self-pay | Admitting: Nurse Practitioner

## 2022-09-17 ENCOUNTER — Ambulatory Visit: Payer: BC Managed Care – PPO | Admitting: Nurse Practitioner

## 2022-09-17 VITALS — BP 109/73 | HR 80 | Temp 98.6°F | Ht 66.0 in | Wt 211.0 lb

## 2022-09-17 DIAGNOSIS — Z6834 Body mass index (BMI) 34.0-34.9, adult: Secondary | ICD-10-CM | POA: Diagnosis not present

## 2022-09-17 DIAGNOSIS — E6609 Other obesity due to excess calories: Secondary | ICD-10-CM

## 2022-09-17 MED ORDER — WEGOVY 2.4 MG/0.75ML ~~LOC~~ SOAJ: 2.4000 mg | SUBCUTANEOUS | 5 refills | Status: DC

## 2022-09-17 NOTE — Progress Notes (Signed)
BP 109/73   Pulse 80   Temp 98.6 F (37 C) (Oral)   Ht 5\' 6"  (1.676 m)   Wt 211 lb (95.7 kg)   LMP 09/12/2022 (Exact Date)   SpO2 98%   BMI 34.06 kg/m    Subjective:    Patient ID: Ruth Adelene Amas, female    DOB: November 16, 1970, 52 y.o.   MRN: [id number]  HPI: Ruth Hanson is a 52 y.o. female  Chief Complaint  Patient presents with   Obesity    3 month f/up   WEIGHT GAIN She was started on Wegovy on 03/11/21, currently on 2.4 MG dosing.   Has lost total of 80 pounds at this time, from 291 lbs to 211 lbs -- 11 pounds lost since last visit.  Reports ongoing decrease in appetite with this and can not eat larger meals, does get some reflux with meals at times. Is working on [employer]. Continues to eat reduced portions at meals. Continues to work on exercise regimen, this has cut back due to schedule -- currently once a week.   Feels more energetic with medication on board.  Reports self esteem improved.    Has underlying back pain which has improved with weight loss.  Gabapentin does make her loopy and have headaches, even with 300 MG. Duration: years Previous attempts at weight loss: yes Complications of obesity: HTN and HLD Peak weight: 300 lbs Weight loss goal: 190 to 195 lbs Weight loss to date: 80 lbs Requesting obesity pharmacotherapy: yes Current weight loss supplements/medications: yes Previous weight loss supplements/meds: yes Calories:  1500   Relevant past medical, surgical, family and social history reviewed and updated as indicated. Interim medical history since our last visit reviewed. Allergies and medications reviewed and updated.  Review of Systems  Constitutional:  Negative for activity change, appetite change, diaphoresis, fatigue and fever.  HENT: Negative.    Respiratory:  Negative for cough, chest tightness, shortness of breath and wheezing.   Cardiovascular:  Negative for chest pain, palpitations and leg swelling.  Gastrointestinal: Negative.    Neurological: Negative.   Psychiatric/Behavioral: Negative.      Per HPI unless specifically indicated above     Objective:    BP 109/73   Pulse 80   Temp 98.6 F (37 C) (Oral)   Ht 5\' 6"  (1.676 m)   Wt 211 lb (95.7 kg)   LMP 09/12/2022 (Exact Date)   SpO2 98%   BMI 34.06 kg/m   Wt Readings from Last 3 Encounters:  09/17/22 211 lb (95.7 kg)  06/16/22 222 lb 12.8 oz (101.1 kg)  03/18/22 233 lb 8 oz (105.9 kg)    Physical Exam Vitals and nursing note reviewed.  Constitutional:      General: She is awake. She is not in acute distress.    Appearance: She is well-developed and well-groomed. She is obese. She is not ill-appearing or toxic-appearing.  HENT:     Head: Normocephalic.     Right Ear: Hearing normal.     Left Ear: Hearing normal.  Eyes:     General: Lids are normal.        Right eye: No discharge.        Left eye: No discharge.     Conjunctiva/sclera: Conjunctivae normal.     Pupils: Pupils are equal, round, and reactive to light.  Neck:     Thyroid: No thyromegaly.     Vascular: No carotid bruit.  Cardiovascular:  Rate and Rhythm: Normal rate and regular rhythm.     Heart sounds: Normal heart sounds. No murmur heard.    No gallop.  Pulmonary:     Effort: Pulmonary effort is normal. No accessory muscle usage or respiratory distress.     Breath sounds: Normal breath sounds.  Abdominal:     General: Bowel sounds are normal. There is no distension.     Palpations: Abdomen is soft.     Tenderness: There is no abdominal tenderness.     Hernia: No hernia is present.  Musculoskeletal:     Cervical back: Normal range of motion and neck supple.     Lumbar back: Normal.     Right lower leg: No edema.     Left lower leg: No edema.  Lymphadenopathy:     Cervical: No cervical adenopathy.  Skin:    General: Skin is warm and dry.  Neurological:     Mental Status: She is alert and oriented to person, place, and time.  Psychiatric:        Attention and  Perception: Attention normal.        Mood and Affect: Mood normal.        Behavior: Behavior normal. Behavior is cooperative.        Thought Content: Thought content normal.        Judgment: Judgment normal.    Results for orders placed or performed in visit on 06/21/22  Basic metabolic panel  Result Value Ref Range   Glucose 80 70 - 99 mg/dL   BUN 11 6 - 24 mg/dL   Creatinine, Ser 6.01 0.57 - 1.00 mg/dL   eGFR 71 >09 NA/TFT/7.32   BUN/Creatinine Ratio 11 9 - 23   Sodium 137 134 - 144 mmol/L   Potassium 3.7 3.5 - 5.2 mmol/L   Chloride 99 96 - 106 mmol/L   CO2 26 20 - 29 mmol/L   Calcium 9.4 8.7 - 10.2 mg/dL      Assessment & Plan:   Problem List Items Addressed This Visit       Other   Obesity - Primary    BMI 34.06, 80 lbs loss total at this time with Wegovy (started 03/11/21), is tolerating and seeing appetite changes + increased energy levels. Will continue Wegovy, 2.4 MG weekly, discussed with patient at length -- will perform PA if needed.  Recommended eating smaller high protein, low fat meals more frequently and exercising 30 mins a day 5 times a week with a goal of 10-15lb weight loss in the next 3 months. Patient voiced their understanding and motivation to adhere to these recommendations. Return in 3 months.       Relevant Medications   Semaglutide-Weight Management (WEGOVY) 2.4 MG/0.75ML SOAJ     Follow up plan: Return in about 3 months (around 12/18/2022) for WEIGHT GAIN.

## 2022-09-17 NOTE — Assessment & Plan Note (Signed)
BMI 34.06, 80 lbs loss total at this time with Wegovy (started 03/11/21), is tolerating and seeing appetite changes + increased energy levels. Will continue Wegovy, 2.4 MG weekly, discussed with patient at length -- will perform PA if needed.  Recommended eating smaller high protein, low fat meals more frequently and exercising 30 mins a day 5 times a week with a goal of 10-15lb weight loss in the next 3 months. Patient voiced their understanding and motivation to adhere to these recommendations. Return in 3 months.

## 2022-09-21 ENCOUNTER — Other Ambulatory Visit: Payer: Self-pay | Admitting: Nurse Practitioner

## 2022-09-22 NOTE — Telephone Encounter (Signed)
Requested Prescriptions  Pending Prescriptions Disp Refills   omeprazole (PRILOSEC) 20 MG capsule [Pharmacy Med Name: OMEPRAZOLE DR 20 MG CAPSULE] 90 capsule 3    Sig: TAKE 1 CAPSULE BY MOUTH EVERY DAY     Gastroenterology: Proton Pump Inhibitors Passed - 09/21/2022  2:45 AM      Passed - Valid encounter within last 12 months    Recent Outpatient Visits           5 days ago Class 1 obesity due to excess calories with serious comorbidity and body mass index (BMI) of 34.0 to 34.9 in adult   [place] [facility] [place], Russell T, NP   3 months ago Class 2 severe obesity due to excess calories with serious comorbidity and body mass index (BMI) of 35.0 to 35.9 in adult [facility])   [place] [facility], [name], NP   6 months ago Class 2 severe obesity due to excess calories with serious comorbidity and body mass index (BMI) of 37.0 to 37.9 in adult [facility])   [place] [facility] [place], [name], NP   9 months ago Class 3 severe obesity due to excess calories with serious comorbidity and body mass index (BMI) of 40.0 to 44.9 in adult [facility])   [place] [facility] [place], Copeland T, NP   1 year ago Class 3 severe obesity due to excess calories with serious comorbidity and body mass index (BMI) of 40.0 to 44.9 in adult [facility])   [place] [facility] [place], Dorie Rank, NP       Future Appointments             In 3 months [name], Dorie Rank, NP [place] [name]   In 6 months [place], Dorie Rank, NP [place] [facility], [name]

## 2022-11-04 ENCOUNTER — Other Ambulatory Visit: Payer: Self-pay | Admitting: Nurse Practitioner

## 2022-11-04 DIAGNOSIS — Z1231 Encounter for screening mammogram for malignant neoplasm of breast: Secondary | ICD-10-CM

## 2022-11-17 ENCOUNTER — Other Ambulatory Visit: Payer: Self-pay | Admitting: Nurse Practitioner

## 2022-11-18 NOTE — Telephone Encounter (Signed)
Requested Prescriptions  Pending Prescriptions Disp Refills   hydrochlorothiazide (HYDRODIURIL) 25 MG tablet [Pharmacy Med Name: HYDROCHLOROTHIAZIDE 25 MG TAB] 90 tablet 0    Sig: TAKE 1 TABLET (25 MG TOTAL) BY MOUTH DAILY.     Cardiovascular: Diuretics - Thiazide Passed - 11/17/2022  5:04 PM      Passed - Cr in normal range and within 180 days    Creatinine, Ser  Date Value Ref Range Status  06/21/2022 0.96 0.57 - 1.00 mg/dL Final         Passed - K in normal range and within 180 days    Potassium  Date Value Ref Range Status  06/21/2022 3.7 3.5 - 5.2 mmol/L Final         Passed - Na in normal range and within 180 days    Sodium  Date Value Ref Range Status  06/21/2022 137 134 - 144 mmol/L Final         Passed - Last BP in normal range    BP Readings from Last 1 Encounters:  09/17/22 109/73         Passed - Valid encounter within last 6 months    Recent Outpatient Visits           2 months ago Class 1 obesity due to excess calories with serious comorbidity and body mass index (BMI) of 34.0 to 34.9 in adult   [place] [facility] [place], Inman T, NP   5 months ago Class 2 severe obesity due to excess calories with serious comorbidity and body mass index (BMI) of 35.0 to 35.9 in adult [facility])   [place] [facility] [place], [name], NP   8 months ago Class 2 severe obesity due to excess calories with serious comorbidity and body mass index (BMI) of 37.0 to 37.9 in adult [facility])   [place] [facility] [place], [name], NP   11 months ago Class 3 severe obesity due to excess calories with serious comorbidity and body mass index (BMI) of 40.0 to 44.9 in adult [facility])   [place] [facility] [place], Adams T, NP   1 year ago Class 3 severe obesity due to excess calories with serious comorbidity and body mass index (BMI) of 40.0 to 44.9 in adult [facility])   [place] [facility] [place], Dorie Rank, NP        Future Appointments             In 1 month [name], Dorie Rank, NP [place] [name]   In 4 months [place], Dorie Rank, NP [place] [facility], [name]

## 2022-12-19 NOTE — Patient Instructions (Signed)
Be Involved in Caring For Your Health:  Taking Medications When medications are taken as directed, they can greatly improve your health. But if they are not taken as prescribed, they may not work. In some cases, not taking them correctly can be harmful. To help ensure your treatment remains effective and safe, understand your medications and how to take them. Bring your medications to each visit for review by your provider.  Your lab results, notes, and after visit summary will be available on My Chart. We strongly encourage you to use this feature. If lab results are abnormal the clinic will contact you with the appropriate steps. If the clinic does not contact you assume the results are satisfactory. You can always view your results on My Chart. If you have questions regarding your health or results, please contact the clinic during office hours. You can also ask questions on My Chart.  We at [facility] are grateful that you chose [place] to provide your care. We strive to provide evidence-based and compassionate care and are always looking for feedback. If you get a survey from the clinic please complete this so we can hear your opinions.  Healthy Eating, Adult Healthy eating may help you get and keep a healthy body weight, reduce the risk of chronic disease, and live a long and productive life. It is important to follow a healthy eating pattern. Your nutritional and calorie needs should be met mainly by different nutrient-rich foods. What are tips for following this plan? Reading food labels Read labels and choose the following: Reduced or low sodium products. Juices with 100% fruit juice. Foods with low saturated fats (<3 g per serving) and high polyunsaturated and monounsaturated fats. Foods with whole grains, such as whole wheat, cracked wheat, brown rice, and wild rice. Whole grains that are fortified with folic acid. This is recommended for females who are pregnant or who want to  become pregnant. Read labels and do not eat or drink the following: Foods or drinks with added sugars. These include foods that contain brown sugar, corn sweetener, corn syrup, dextrose, fructose, glucose, high-fructose corn syrup, honey, invert sugar, lactose, malt syrup, maltose, molasses, raw sugar, sucrose, trehalose, or turbinado sugar. Limit your intake of added sugars to less than 10% of your total daily calories. Do not eat more than the following amounts of added sugar per day: 6 teaspoons (25 g) for females. 9 teaspoons (38 g) for males. Foods that contain processed or refined starches and grains. Refined grain products, such as white flour, degermed cornmeal, white bread, and white rice. Shopping Choose nutrient-rich snacks, such as vegetables, whole fruits, and nuts. Avoid high-calorie and high-sugar snacks, such as potato chips, fruit snacks, and candy. Use oil-based dressings and spreads on foods instead of solid fats such as butter, margarine, sour cream, or cream cheese. Limit pre-made sauces, mixes, and "instant" products such as flavored rice, instant noodles, and ready-made pasta. Try more plant-protein sources, such as tofu, tempeh, black beans, edamame, lentils, nuts, and seeds. Explore eating plans such as the Mediterranean diet or vegetarian diet. Try heart-healthy dips made with beans and healthy fats like hummus and guacamole. Vegetables go great with these. Cooking Use oil to saut or stir-fry foods instead of solid fats such as butter, margarine, or lard. Try baking, boiling, grilling, or broiling instead of frying. Remove the fatty part of meats before cooking. Steam vegetables in water or broth. Meal planning  At meals, imagine dividing your plate into fourths: One-half of  your plate is fruits and vegetables. One-fourth of your plate is whole grains. One-fourth of your plate is protein, especially lean meats, poultry, eggs, tofu, beans, or nuts. Include low-fat  dairy as part of your daily diet. Lifestyle Choose healthy options in all settings, including home, work, school, restaurants, or stores. Prepare your food safely: Wash your hands after handling raw meats. Where you prepare food, keep surfaces clean by regularly washing with hot, soapy water. Keep raw meats separate from ready-to-eat foods, such as fruits and vegetables. Cook seafood, meat, poultry, and eggs to the recommended temperature. Get a food thermometer. Store foods at safe temperatures. In general: Keep cold foods at 48F (4.4C) or below. Keep hot foods at 148F (60C) or above. Keep your freezer at [facility] (-17.8C) or below. Foods are not safe to eat if they have been between the temperatures of 40-148F (4.4-60C) for more than 2 hours. What foods should I eat? Fruits Aim to eat 1-2 cups of fresh, canned (in natural juice), or frozen fruits each day. One cup of fruit equals 1 small apple, 1 large banana, 8 large strawberries, 1 cup (237 g) canned fruit,  cup (82 g) dried fruit, or 1 cup (240 mL) 100% juice. Vegetables Aim to eat 2-4 cups of fresh and frozen vegetables each day, including different varieties and colors. One cup of vegetables equals 1 cup (91 g) broccoli or cauliflower florets, 2 medium carrots, 2 cups (150 g) raw, leafy greens, 1 large tomato, 1 large bell pepper, 1 large sweet potato, or 1 medium white potato. Grains Aim to eat 5-10 ounce-equivalents of whole grains each day. Examples of 1 ounce-equivalent of grains include 1 slice of bread, 1 cup (40 g) ready-to-eat cereal, 3 cups (24 g) popcorn, or  cup (93 g) cooked rice. Meats and other proteins Try to eat 5-7 ounce-equivalents of protein each day. Examples of 1 ounce-equivalent of protein include 1 egg,  oz nuts (12 almonds, 24 pistachios, or 7 walnut halves), 1/4 cup (90 g) cooked beans, 6 tablespoons (90 g) hummus or 1 tablespoon (16 g) peanut butter. A cut of meat or fish that is the size of a deck of  cards is about 3-4 ounce-equivalents (85 g). Of the protein you eat each week, try to have at least 8 sounce (227 g) of seafood. This is about 2 servings per week. This includes salmon, trout, herring, sardines, and anchovies. Dairy Aim to eat 3 cup-equivalents of fat-free or low-fat dairy each day. Examples of 1 cup-equivalent of dairy include 1 cup (240 mL) milk, 8 ounces (250 g) yogurt, 1 ounces (44 g) natural cheese, or 1 cup (240 mL) fortified soy milk. Fats and oils Aim for about 5 teaspoons (21 g) of fats and oils per day. Choose monounsaturated fats, such as canola and olive oils, mayonnaise made with olive oil or avocado oil, avocados, peanut butter, and most nuts, or polyunsaturated fats, such as sunflower, corn, and soybean oils, walnuts, pine nuts, sesame seeds, sunflower seeds, and flaxseed. Beverages Aim for 6 eight-ounce glasses of water per day. Limit coffee to 3-5 eight-ounce cups per day. Limit caffeinated beverages that have added calories, such as soda and energy drinks. If you drink alcohol: Limit how much you have to: 0-1 drink a day if you are female. 0-2 drinks a day if you are female. Know how much alcohol is in your drink. In the [place] one drink is one 12 oz bottle of beer (355 mL), one 5 oz glass of wine (  148 mL), or one 1 oz glass of hard liquor (44 mL). Seasoning and other foods Try not to add too much salt to your food. Try using herbs and spices instead of salt. Try not to add sugar to food. This information is based on [place] nutrition guidelines. To learn more, visit DisposableNylon.be. Exact amounts may vary. You may need different amounts. This information is not intended to replace advice given to you by your health care provider. Make sure you discuss any questions you have with your health care provider. Document Revised: 10/26/2021 Document Reviewed: 10/26/2021 Elsevier Patient Education  2024 [employer].

## 2022-12-20 ENCOUNTER — Ambulatory Visit
Admission: RE | Admit: 2022-12-20 | Discharge: 2022-12-20 | Disposition: A | Discharge status: Home / Self Care | Payer: BC Managed Care – PPO | Source: Ambulatory Visit | Attending: Nurse Practitioner | Admitting: Nurse Practitioner

## 2022-12-20 DIAGNOSIS — Z1231 Encounter for screening mammogram for malignant neoplasm of breast: Secondary | ICD-10-CM | POA: Insufficient documentation

## 2022-12-21 ENCOUNTER — Ambulatory Visit: Payer: BC Managed Care – PPO | Admitting: Nurse Practitioner

## 2022-12-21 ENCOUNTER — Encounter: Payer: Self-pay | Admitting: Nurse Practitioner

## 2022-12-21 VITALS — BP 112/76 | HR 85 | Temp 98.9°F | Ht 66.0 in | Wt 203.2 lb

## 2022-12-21 DIAGNOSIS — I10 Essential (primary) hypertension: Secondary | ICD-10-CM | POA: Diagnosis not present

## 2022-12-21 DIAGNOSIS — E6609 Other obesity due to excess calories: Secondary | ICD-10-CM | POA: Diagnosis not present

## 2022-12-21 DIAGNOSIS — E66811 Obesity, class 1: Secondary | ICD-10-CM | POA: Diagnosis not present

## 2022-12-21 DIAGNOSIS — Z6834 Body mass index (BMI) 34.0-34.9, adult: Secondary | ICD-10-CM

## 2022-12-21 DIAGNOSIS — J0110 Acute frontal sinusitis, unspecified: Secondary | ICD-10-CM

## 2022-12-21 DIAGNOSIS — J321 Chronic frontal sinusitis: Secondary | ICD-10-CM | POA: Insufficient documentation

## 2022-12-21 DIAGNOSIS — Z23 Encounter for immunization: Secondary | ICD-10-CM

## 2022-12-21 MED ORDER — WEGOVY 2.4 MG/0.75ML ~~LOC~~ SOAJ: 2.4000 mg | SUBCUTANEOUS | 5 refills | Status: DC

## 2022-12-21 MED ORDER — PREDNISONE 20 MG PO TABS: 40.0000 mg | ORAL_TABLET | Freq: Every day | ORAL | 0 refills | Status: AC

## 2022-12-21 NOTE — Assessment & Plan Note (Addendum)
BMI 32.80, 87 lbs loss total at this time with Wegovy (started 03/11/21), is tolerating and seeing appetite changes + increased energy levels. Will continue Wegovy, 2.4 MG weekly, discussed with patient at length -- will perform PA if needed.  Recommended eating smaller high protein, low fat meals more frequently and exercising 30 mins a day 5 times a week with a goal of 10-15lb weight loss in the next 3 months. Patient voiced their understanding and motivation to adhere to these recommendations. Return in February.

## 2022-12-21 NOTE — Progress Notes (Signed)
BP 112/76   Pulse 85   Temp 98.9 F (37.2 C) (Oral)   Ht 5\' 6"  (1.676 m)   Wt 203 lb 3.2 oz (92.2 kg)   LMP 12/02/2022 (Exact Date)   SpO2 98%   BMI 32.80 kg/m    Subjective:    Patient ID: Ruth Hanson, female    DOB: 29-May-1970, 52 y.o.   MRN: [id number]  HPI: Ruth Hanson is a 52 y.o. female  Chief Complaint  Patient presents with   Obesity    3 month f/up   WEIGHT GAIN She was started on Wegovy on 03/11/21, continues on 2.4 MG dosing.   Has lost total of 87 pounds at this time, from 291 lbs to 208 lbs -- 7 pounds lost since last visit.  Continues to have decrease in appetite with this. Is working on [employer]. Eating reduced portions at meals. Exercise regimen is less at present, gets tired after work.  Currently works 8 am to 4 pm, but they have laid people off recently  Has underlying back pain which has improved with weight loss.  Gabapentin does make her loopy and have headaches, even with 300 MG. Duration: years Previous attempts at weight loss: yes Complications of obesity: HTN and HLD Peak weight: 300 lbs Weight loss goal: 190 to 195 lbs Weight loss to date: 87 lbs Requesting obesity pharmacotherapy: yes Current weight loss supplements/medications: yes Previous weight loss supplements/meds: yes Calories:  2000  UPPER RESPIRATORY TRACT INFECTION Has been having sinus issues since Friday.  More to right side. Did have two teeth pulled left side on October 16th and this caused sinus issues after. Fever: no Cough: no Shortness of breath: no Wheezing: no Chest pain: no Chest tightness: no Chest congestion: no Nasal congestion: yes Runny nose: yes Post nasal drip: yes Sneezing: yes Sore throat: no Swollen glands: no Sinus pressure: yes right side Headache: yes right side Face pain: yes right side Toothache: no Ear pain: none Ear pressure: none Eyes red/itching:no Eye drainage/crusting: no  Vomiting: no Rash: no Fatigue:   occasional Sick contacts: no Strep contacts: no  Context: fluctuating Recurrent sinusitis: no Relief with OTC cold/cough medications: no  Treatments attempted: Nyquil, Vicks Vapo Rub    Relevant past medical, surgical, family and social history reviewed and updated as indicated. Interim medical history since our last visit reviewed. Allergies and medications reviewed and updated.  Review of Systems  Constitutional:  Positive for fatigue. Negative for activity change, appetite change, diaphoresis and fever.  HENT:  Positive for congestion, postnasal drip, rhinorrhea, sinus pressure and sinus pain. Negative for ear discharge, ear pain, sore throat and voice change.   Respiratory:  Negative for cough, chest tightness, shortness of breath and wheezing.   Cardiovascular:  Negative for chest pain, palpitations and leg swelling.  Gastrointestinal: Negative.   Neurological:  Positive for headaches.  Psychiatric/Behavioral: Negative.      Per HPI unless specifically indicated above     Objective:    BP 112/76   Pulse 85   Temp 98.9 F (37.2 C) (Oral)   Ht 5\' 6"  (1.676 m)   Wt 203 lb 3.2 oz (92.2 kg)   LMP 12/02/2022 (Exact Date)   SpO2 98%   BMI 32.80 kg/m   Wt Readings from Last 3 Encounters:  12/21/22 203 lb 3.2 oz (92.2 kg)  09/17/22 211 lb (95.7 kg)  06/16/22 222 lb 12.8 oz (101.1 kg)    Physical Exam Vitals and  nursing note reviewed.  Constitutional:      General: She is awake. She is not in acute distress.    Appearance: She is well-developed and well-groomed. She is obese. She is ill-appearing. She is not toxic-appearing.  HENT:     Head: Normocephalic.     Right Ear: Hearing, ear canal and external ear normal. A middle ear effusion is present. Tympanic membrane is not injected or perforated.     Left Ear: Hearing, ear canal and external ear normal. A middle ear effusion is present. Tympanic membrane is not injected or perforated.     Nose: Rhinorrhea present.  Rhinorrhea is clear.     Right Sinus: Frontal sinus tenderness present. No maxillary sinus tenderness.     Left Sinus: No maxillary sinus tenderness or frontal sinus tenderness.     Mouth/Throat:     Mouth: Mucous membranes are moist.     Pharynx: Posterior oropharyngeal erythema (mild) present. No pharyngeal swelling or oropharyngeal exudate.  Eyes:     General: Lids are normal.        Right eye: No discharge.        Left eye: No discharge.     Conjunctiva/sclera: Conjunctivae normal.     Pupils: Pupils are equal, round, and reactive to light.  Neck:     Thyroid: No thyromegaly.     Vascular: No carotid bruit.  Cardiovascular:     Rate and Rhythm: Normal rate and regular rhythm.     Heart sounds: Normal heart sounds. No murmur heard.    No gallop.  Pulmonary:     Effort: Pulmonary effort is normal. No accessory muscle usage or respiratory distress.     Breath sounds: Normal breath sounds. No decreased breath sounds, wheezing or rhonchi.  Abdominal:     General: Bowel sounds are normal.     Palpations: Abdomen is soft. There is no hepatomegaly or splenomegaly.  Musculoskeletal:     Cervical back: Normal range of motion and neck supple.     Right lower leg: No edema.     Left lower leg: No edema.  Lymphadenopathy:     Head:     Right side of head: No submental, submandibular, tonsillar, preauricular or posterior auricular adenopathy.     Left side of head: No submental, submandibular, tonsillar, preauricular or posterior auricular adenopathy.     Cervical: No cervical adenopathy.  Skin:    General: Skin is warm and dry.  Neurological:     Mental Status: She is alert and oriented to person, place, and time.  Psychiatric:        Attention and Perception: Attention normal.        Mood and Affect: Mood normal.        Speech: Speech normal.        Behavior: Behavior normal. Behavior is cooperative.        Thought Content: Thought content normal.    Results for orders placed or  performed in visit on 06/21/22  Basic metabolic panel  Result Value Ref Range   Glucose 80 70 - 99 mg/dL   BUN 11 6 - 24 mg/dL   Creatinine, Ser 1.61 0.57 - 1.00 mg/dL   eGFR 71 >09 UE/AVW/0.98   BUN/Creatinine Ratio 11 9 - 23   Sodium 137 134 - 144 mmol/L   Potassium 3.7 3.5 - 5.2 mmol/L   Chloride 99 96 - 106 mmol/L   CO2 26 20 - 29 mmol/L   Calcium 9.4 8.7 - 10.2  mg/dL      Assessment & Plan:   Problem List Items Addressed This Visit       Cardiovascular and Mediastinum   Essential hypertension    Chronic, stable. BP much improved with weight loss.  Recommend she monitor BP at least a few mornings a week at home and document.  DASH diet at home.  Continue current medication regimen and adjust as needed.  Labs: BMP to recheck K+ level.  Refills up to date.        Relevant Orders   Basic metabolic panel     Respiratory   Frontal sinusitis    Acute for 5 days.  Discussed viral infections vs bacterial infections with patient + when abx therapy is used.  Will send in Prednisone 40 MG daily for 5 days and recommend she start Claritin 10 MG daily for 7 days + Flonase.  Continue Vicks Vapo rub.  If not improved by Monday next week she is to alert provider and will send in abx therapy.  Recommend: - Increased rest - Increasing Fluids - Acetaminophen / ibuprofen as needed for fever/pain.  - Salt water gargling, chloraseptic spray and throat lozenges  - Mucinex.  - Saline sinus flushes or a neti pot.  - Humidifying the air.       Relevant Medications   predniSONE (DELTASONE) 20 MG tablet     Other   Obesity - Primary    BMI 32.80, 87 lbs loss total at this time with Wegovy (started 03/11/21), is tolerating and seeing appetite changes + increased energy levels. Will continue Wegovy, 2.4 MG weekly, discussed with patient at length -- will perform PA if needed.  Recommended eating smaller high protein, low fat meals more frequently and exercising 30 mins a day 5 times a week with a  goal of 10-15lb weight loss in the next 3 months. Patient voiced their understanding and motivation to adhere to these recommendations. Return in February.       Relevant Medications   Semaglutide-Weight Management (WEGOVY) 2.4 MG/0.75ML SOAJ   Other Relevant Orders   Basic metabolic panel     Follow up plan: Return in about 3 months (around 03/21/2023) for Annual physical after 03/19/23.

## 2022-12-21 NOTE — Assessment & Plan Note (Signed)
Acute for 5 days.  Discussed viral infections vs bacterial infections with patient + when abx therapy is used.  Will send in Prednisone 40 MG daily for 5 days and recommend she start Claritin 10 MG daily for 7 days + Flonase.  Continue Vicks Vapo rub.  If not improved by Monday next week she is to alert provider and will send in abx therapy.  Recommend: - Increased rest - Increasing Fluids - Acetaminophen / ibuprofen as needed for fever/pain.  - Salt water gargling, chloraseptic spray and throat lozenges  - Mucinex.  - Saline sinus flushes or a neti pot.  - Humidifying the air.

## 2022-12-21 NOTE — Assessment & Plan Note (Signed)
Chronic, stable. BP much improved with weight loss.  Recommend she monitor BP at least a few mornings a week at home and document.  DASH diet at home.  Continue current medication regimen and adjust as needed.  Labs: BMP to recheck K+ level.  Refills up to date.

## 2022-12-21 NOTE — Progress Notes (Signed)
Contacted via MyChart   Normal mammogram, may repeat in one year:)

## 2022-12-22 LAB — BASIC METABOLIC PANEL
BUN/Creatinine Ratio: 13 (ref 9–23)
BUN: 11 mg/dL (ref 6–24)
CO2: 24 mmol/L (ref 20–29)
Calcium: 9.1 mg/dL (ref 8.7–10.2)
Chloride: 98 mmol/L (ref 96–106)
Creatinine, Ser: 0.85 mg/dL (ref 0.57–1.00)
Glucose: 79 mg/dL (ref 70–99)
Potassium: 3.7 mmol/L (ref 3.5–5.2)
Sodium: 136 mmol/L (ref 134–144)
eGFR: 82 mL/min/{1.73_m2} (ref >59)

## 2022-12-22 NOTE — Progress Notes (Signed)
Contacted via MyChart    Good afternoon Ruth Hanson, your labs have returned and all is good.  Great news!!  Have a wonderful day!! Keep being stellar!!  Thank you for allowing me to participate in your care.  I appreciate you. Kindest regards, Caden Fatica

## 2023-02-23 DIAGNOSIS — R768 Other specified abnormal immunological findings in serum: Secondary | ICD-10-CM | POA: Diagnosis not present

## 2023-02-23 DIAGNOSIS — M5136 Other intervertebral disc degeneration, lumbar region with discogenic back pain only: Secondary | ICD-10-CM | POA: Diagnosis not present

## 2023-02-23 DIAGNOSIS — M1711 Unilateral primary osteoarthritis, right knee: Secondary | ICD-10-CM | POA: Diagnosis not present

## 2023-02-23 DIAGNOSIS — Z791 Long term (current) use of non-steroidal anti-inflammatories (NSAID): Secondary | ICD-10-CM | POA: Diagnosis not present

## 2023-03-12 ENCOUNTER — Other Ambulatory Visit: Payer: Self-pay | Admitting: Nurse Practitioner

## 2023-03-14 NOTE — Telephone Encounter (Signed)
Requested Prescriptions  Pending Prescriptions Disp Refills   hydrochlorothiazide (HYDRODIURIL) 25 MG tablet [Pharmacy Med Name: HYDROCHLOROTHIAZIDE 25 MG TAB] 90 tablet 1    Sig: TAKE 1 TABLET (25 MG TOTAL) BY MOUTH DAILY.     Cardiovascular: Diuretics - Thiazide Passed - 03/14/2023  1:45 PM      Passed - Cr in normal range and within 180 days    Creatinine, Ser  Date Value Ref Range Status  12/21/2022 0.85 0.57 - 1.00 mg/dL Final         Passed - K in normal range and within 180 days    Potassium  Date Value Ref Range Status  12/21/2022 3.7 3.5 - 5.2 mmol/L Final         Passed - Na in normal range and within 180 days    Sodium  Date Value Ref Range Status  12/21/2022 136 134 - 144 mmol/L Final         Passed - Last BP in normal range    BP Readings from Last 1 Encounters:  12/21/22 112/76         Passed - Valid encounter within last 6 months    Recent Outpatient Visits           2 months ago Class 1 obesity due to excess calories with serious comorbidity and body mass index (BMI) of 34.0 to 34.9 in adult   [place] [facility] [place], [place] T, NP   5 months ago Class 1 obesity due to excess calories with serious comorbidity and body mass index (BMI) of 34.0 to 34.9 in adult   [place] [facility] [place], [place] T, NP   9 months ago Class 2 severe obesity due to excess calories with serious comorbidity and body mass index (BMI) of 35.0 to 35.9 in adult [facility])   [place] [facility] Z[place], [name], NP   12 months ago Class 2 severe obesity due to excess calories with serious comorbidity and body mass index (BMI) of 37.0 to 37.9 in adult [facility])   [place] [facility] [place], [place] T, NP   1 year ago Class 3 severe obesity due to excess calories with serious comorbidity and body mass index (BMI) of 40.0 to 44.9 in adult [facility])   [place] [facility] [place], Dorie Rank, NP       Future  Appointments             In 1 week [name], Dorie Rank, NP [name]   In 1 week [place], Dorie Rank, NP  [facility], [name]

## 2023-03-19 NOTE — Patient Instructions (Addendum)
 Start doing pelvic floor therapy exercises and Kegel exercises at home to help strengthen pelvic floor.  Be Involved in Caring For Your Health:  Taking Medications When medications are taken as directed, they can greatly improve your health. But if they are not taken as prescribed, they may not work. In some cases, not taking them correctly can be harmful. To help ensure your treatment remains effective and safe, understand your medications and how to take them. Bring your medications to each visit for review by your provider.  Your lab results, notes, and after visit summary will be available on My Chart. We strongly encourage you to use this feature. If lab results are abnormal the clinic will contact you with the appropriate steps. If the clinic does not contact you assume the results are satisfactory. You can always view your results on My Chart. If you have questions regarding your health or results, please contact the clinic during office hours. You can also ask questions on My Chart.  We at [facility] are grateful that you chose us  to provide your care. We strive to provide evidence-based and compassionate care and are always looking for feedback. If you get a survey from the clinic please complete this so we can hear your opinions.  Healthy Eating, Adult Healthy eating may help you get and keep a healthy body weight, reduce the risk of chronic disease, and live a long and productive life. It is important to follow a healthy eating pattern. Your nutritional and calorie needs should be met mainly by different nutrient-rich foods. What are tips for following this plan? Reading food labels Read labels and choose the following: Reduced or low sodium products. Juices with 100% fruit juice. Foods with low saturated fats (<3 g per serving) and high polyunsaturated and monounsaturated fats. Foods with whole grains, such as whole wheat, cracked wheat, brown rice, and wild rice. Whole  grains that are fortified with folic acid. This is recommended for females who are pregnant or who want to become pregnant. Read labels and do not eat or drink the following: Foods or drinks with added sugars. These include foods that contain brown sugar, corn sweetener, corn syrup, dextrose, fructose, glucose, high-fructose corn syrup, honey, invert sugar, lactose, malt syrup, maltose, molasses, raw sugar, sucrose, trehalose, or turbinado sugar. Limit your intake of added sugars to less than 10% of your total daily calories. Do not eat more than the following amounts of added sugar per day: 6 teaspoons (25 g) for females. 9 teaspoons (38 g) for males. Foods that contain processed or refined starches and grains. Refined grain products, such as white flour, degermed cornmeal, white bread, and white rice. Shopping Choose nutrient-rich snacks, such as vegetables, whole fruits, and nuts. Avoid high-calorie and high-sugar snacks, such as potato chips, fruit snacks, and candy. Use oil-based dressings and spreads on foods instead of solid fats such as butter, margarine, sour cream, or cream cheese. Limit pre-made sauces, mixes, and "instant" products such as flavored rice, instant noodles, and ready-made pasta. Try more plant-protein sources, such as tofu, tempeh, black beans, edamame, lentils, nuts, and seeds. Explore eating plans such as the Mediterranean diet or vegetarian diet. Try heart-healthy dips made with beans and healthy fats like hummus and guacamole. Vegetables go great with these. Cooking Use oil to saut or stir-fry foods instead of solid fats such as butter, margarine, or lard. Try baking, boiling, grilling, or broiling instead of frying. Remove the fatty part of meats before cooking. Steam vegetables  in water or broth. Meal planning  At meals, imagine dividing your plate into fourths: One-half of your plate is fruits and vegetables. One-fourth of your plate is whole  grains. One-fourth of your plate is protein, especially lean meats, poultry, eggs, tofu, beans, or nuts. Include low-fat dairy as part of your daily diet. Lifestyle Choose healthy options in all settings, including home, work, school, restaurants, or stores. Prepare your food safely: Wash your hands after handling raw meats. Where you prepare food, keep surfaces clean by regularly washing with hot, soapy water. Keep raw meats separate from ready-to-eat foods, such as fruits and vegetables. Cook seafood, meat, poultry, and eggs to the recommended temperature. Get a food thermometer. Store foods at safe temperatures. In general: Keep cold foods at 73F (4.4C) or below. Keep hot foods at 173F (60C) or above. Keep your freezer at [employer] (-17.8C) or below. Foods are not safe to eat if they have been between the temperatures of 40-173F (4.4-60C) for more than 2 hours. What foods should I eat? Fruits Aim to eat 1-2 cups of fresh, canned (in natural juice), or frozen fruits each day. One cup of fruit equals 1 small apple, 1 large banana, 8 large strawberries, 1 cup (237 g) canned fruit,  cup (82 g) dried fruit, or 1 cup (240 mL) 100% juice. Vegetables Aim to eat 2-4 cups of fresh and frozen vegetables each day, including different varieties and colors. One cup of vegetables equals 1 cup (91 g) broccoli or cauliflower florets, 2 medium carrots, 2 cups (150 g) raw, leafy greens, 1 large tomato, 1 large bell pepper, 1 large sweet potato, or 1 medium white potato. Grains Aim to eat 5-10 ounce-equivalents of whole grains each day. Examples of 1 ounce-equivalent of grains include 1 slice of bread, 1 cup (40 g) ready-to-eat cereal, 3 cups (24 g) popcorn, or  cup (93 g) cooked rice. Meats and other proteins Try to eat 5-7 ounce-equivalents of protein each day. Examples of 1 ounce-equivalent of protein include 1 egg,  oz nuts (12 almonds, 24 pistachios, or 7 walnut halves), 1/4 cup (90 g) cooked beans,  6 tablespoons (90 g) hummus or 1 tablespoon (16 g) peanut butter. A cut of meat or fish that is the size of a deck of cards is about 3-4 ounce-equivalents (85 g). Of the protein you eat each week, try to have at least 8 sounce (227 g) of seafood. This is about 2 servings per week. This includes salmon, trout, herring, sardines, and anchovies. Dairy Aim to eat 3 cup-equivalents of fat-free or low-fat dairy each day. Examples of 1 cup-equivalent of dairy include 1 cup (240 mL) milk, 8 ounces (250 g) yogurt, 1 ounces (44 g) natural cheese, or 1 cup (240 mL) fortified soy milk. Fats and oils Aim for about 5 teaspoons (21 g) of fats and oils per day. Choose monounsaturated fats, such as canola and olive oils, mayonnaise made with olive oil or avocado oil, avocados, peanut butter, and most nuts, or polyunsaturated fats, such as sunflower, corn, and soybean oils, walnuts, pine nuts, sesame seeds, sunflower seeds, and flaxseed. Beverages Aim for 6 eight-ounce glasses of water per day. Limit coffee to 3-5 eight-ounce cups per day. Limit caffeinated beverages that have added calories, such as soda and energy drinks. If you drink alcohol: Limit how much you have to: 0-1 drink a day if you are female. 0-2 drinks a day if you are female. Know how much alcohol is in your drink. In the [place]  one drink is one 12 oz bottle of beer (355 mL), one 5 oz glass of wine (148 mL), or one 1 oz glass of hard liquor (44 mL). Seasoning and other foods Try not to add too much salt to your food. Try using herbs and spices instead of salt. Try not to add sugar to food. This information is based on [place] nutrition guidelines. To learn more, visit [employer]. Exact amounts may vary. You may need different amounts. This information is not intended to replace advice given to you by your health care provider. Make sure you discuss any questions you have with your health care provider. Document Revised: 10/26/2021 Document  Reviewed: 10/26/2021 Elsevier Patient Education  2024 [employer].

## 2023-03-21 ENCOUNTER — Encounter: Payer: Self-pay | Admitting: Nurse Practitioner

## 2023-03-21 ENCOUNTER — Other Ambulatory Visit (HOSPITAL_COMMUNITY)
Admission: RE | Admit: 2023-03-21 | Discharge: 2023-03-21 | Disposition: A | Discharge status: Home / Self Care | Payer: BC Managed Care – PPO | Source: Ambulatory Visit | Attending: Nurse Practitioner | Admitting: Nurse Practitioner

## 2023-03-21 ENCOUNTER — Ambulatory Visit (INDEPENDENT_AMBULATORY_CARE_PROVIDER_SITE_OTHER): Payer: BC Managed Care – PPO | Admitting: Nurse Practitioner

## 2023-03-21 VITALS — BP 114/73 | HR 77 | Temp 98.1°F | Ht 67.1 in | Wt 199.4 lb

## 2023-03-21 DIAGNOSIS — N951 Menopausal and female climacteric states: Secondary | ICD-10-CM

## 2023-03-21 DIAGNOSIS — Z124 Encounter for screening for malignant neoplasm of cervix: Secondary | ICD-10-CM | POA: Diagnosis not present

## 2023-03-21 DIAGNOSIS — D649 Anemia, unspecified: Secondary | ICD-10-CM

## 2023-03-21 DIAGNOSIS — Z0000 Encounter for general adult medical examination without abnormal findings: Secondary | ICD-10-CM | POA: Diagnosis not present

## 2023-03-21 DIAGNOSIS — K219 Gastro-esophageal reflux disease without esophagitis: Secondary | ICD-10-CM

## 2023-03-21 DIAGNOSIS — E559 Vitamin D deficiency, unspecified: Secondary | ICD-10-CM | POA: Diagnosis not present

## 2023-03-21 DIAGNOSIS — R7689 Other specified abnormal immunological findings in serum: Secondary | ICD-10-CM

## 2023-03-21 DIAGNOSIS — I10 Essential (primary) hypertension: Secondary | ICD-10-CM

## 2023-03-21 DIAGNOSIS — Z6834 Body mass index (BMI) 34.0-34.9, adult: Secondary | ICD-10-CM

## 2023-03-21 DIAGNOSIS — E66811 Obesity, class 1: Secondary | ICD-10-CM

## 2023-03-21 DIAGNOSIS — R768 Other specified abnormal immunological findings in serum: Secondary | ICD-10-CM

## 2023-03-21 DIAGNOSIS — E6609 Other obesity due to excess calories: Secondary | ICD-10-CM

## 2023-03-21 LAB — MICROALBUMIN, URINE WAIVED
Creatinine, Urine Waived: 50 mg/dL (ref 10–300)
Microalb, Ur Waived: 10 mg/L (ref 0–19)

## 2023-03-21 MED ORDER — HYDROCHLOROTHIAZIDE 25 MG PO TABS: 25.0000 mg | ORAL_TABLET | Freq: Every day | ORAL | 4 refills | Status: AC

## 2023-03-21 MED ORDER — WEGOVY 2.4 MG/0.75ML ~~LOC~~ SOAJ: 2.4000 mg | SUBCUTANEOUS | 5 refills | Status: DC

## 2023-03-21 MED ORDER — NIKKI 3-0.02 MG PO TABS: 1.0000 | ORAL_TABLET | Freq: Every day | ORAL | 12 refills | Status: AC

## 2023-03-21 NOTE — Assessment & Plan Note (Signed)
 BMI 31.14, 92 lbs loss total at this time with Wegovy  (started 03/11/21), is tolerating and seeing appetite changes + increased energy levels. Will continue Wegovy , 2.4 MG weekly, discussed with patient at length -- will perform PA if needed as this regimen is offering benefit and she is almost at goal.  Recommended eating smaller high protein, low fat meals more frequently and exercising 30 mins a day 5 times a week with a goal of 10-15lb weight loss in the next 3 months. Patient voiced their understanding and motivation to adhere to these recommendations.

## 2023-03-21 NOTE — Progress Notes (Signed)
 BP 114/73   Pulse 77   Temp 98.1 F (36.7 C) (Oral)   Ht 5' 7.1" (1.704 m)   Wt 199 lb 6.4 oz (90.4 kg)   LMP 02/21/2023 (Approximate)   SpO2 99%   BMI 31.14 kg/m    Subjective:    Patient ID: Ruth Rubbie Cornwall, female    DOB: [birth year], 53 y.o.   MRN: [id number]  HPI: Ruth Hanson is a 53 y.o. female presenting on 03/21/2023 for comprehensive medical examination. Current medical complaints include:none  She currently lives with: self & daughter Menopausal Symptoms:yes -- continues on [name]  for control, still has cycles monthly at this time.  Took Gabapentin  for symptoms at one time, but stopped as made her have headaches.  Continues on Omeprazole  for GERD, takes this as needed only. Often takes once a week. Continues Vitamin D  weekly for history of low levels.  HYPERTENSION without Chronic Kidney Disease Takes HCTZ, has been on for years.  She continues on Wegovy  2.4 MG weekly for weight loss which was started on 03/11/21 when she was 291 lbs.  Her goal is to get to 190-195 lbs to help with her blood pressure and underlying chronic joint pain issues.  Has lost 92 lbs total at this time and is almost to her goal weight.    She saw Dr. Lydia Sams with rheumatology last on 02/22/22 for her + ANA, no changes made.  Has history of low hemoglobin levels which are monitored. Hypertension status: controlled  Satisfied with current treatment? yes Duration of hypertension: chronic BP monitoring frequency:  not checking BP range:  BP medication side effects:  no Medication compliance: good compliance Aspirin: no Recurrent headaches: no Visual changes: no Palpitations: no Dyspnea: no Chest pain: no Lower extremity edema: no Dizzy/lightheaded: no  The 10-year ASCVD risk score (Arnett DK, et al., 2019) is: 1.5%   Values used to calculate the score:     Age: 53 years     Sex: Female     Is Non-Hispanic African American: Yes     Diabetic: No     Tobacco smoker: No     Systolic Blood  Pressure: 114 mmHg     Is BP treated: Yes     HDL Cholesterol: 96 mg/dL     Total Cholesterol: 198 mg/dL   Depression Screen done today and results listed below:     03/21/2023    8:10 AM 03/18/2022    8:04 AM 12/31/2020    9:24 AM  Depression screen PHQ 2/9  Decreased Interest 0 0 0  Down, Depressed, Hopeless 0 0 0  PHQ - 2 Score 0 0 0  Altered sleeping 0 0 0  Tired, decreased energy 0 0 3  Change in appetite 0 0 0  Feeling bad or failure about yourself  0 0 0  Trouble concentrating 0 0 0  Moving slowly or fidgety/restless 0 0 0  Suicidal thoughts 0 0 0  PHQ-9 Score 0 0 3  Difficult doing work/chores Not difficult at all Not difficult at all       03/21/2023    8:11 AM 03/18/2022    8:04 AM 12/31/2020    9:24 AM  GAD 7 : Generalized Anxiety Score  Nervous, Anxious, on Edge 0 0 0  Control/stop worrying 0 0 0  Worry too much - different things 0 0 3  Trouble relaxing 0 0 0  Restless 0 0 0  Easily annoyed or irritable 0 0 0  Afraid - awful might happen 0 0 0  Total GAD 7 Score 0 0 3  Anxiety Difficulty Not difficult at all Not difficult at all       12/31/2020    9:24 AM 02/03/2021    7:42 AM 03/18/2022    8:04 AM 03/18/2022    8:21 AM 09/17/2022    8:58 AM  Fall Risk  Falls in the past year? 0  0 0 0  Was there an injury with Fall? 0  0 0 0  Fall Risk Category Calculator 0  0 0 0  Fall Risk Category (Retired) Low      (RETIRED) Patient Fall Risk Level Low fall risk Low fall risk     Patient at Risk for Falls Due to No Fall Risks  No Fall Risks No Fall Risks No Fall Risks  Fall risk Follow up Falls evaluation completed  Falls evaluation completed Falls prevention discussed Falls evaluation completed     Past Medical History:  History reviewed. No pertinent past medical history.  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  BACKGROUND FIBROADENOMATOID CHANGES, FOCAL APOCRINE CHANGE, AND FOCAL CHANGES SUGGESTIVE OF BENIGN CYST WALL. NEGATIVE FOR ATYPICAL PROLIFERATIVE BREAST DISEASE of the LEFT breast, 6:00 o'clock.   COLONOSCOPY WITH PROPOFOL  N/A 02/03/2021   Procedure: COLONOSCOPY WITH PROPOFOL ;  Surgeon: Marnee Sink, MD;  Location: [facility] ENDOSCOPY;  Service: Endoscopy;  Laterality: N/A;    Medications:  Current Outpatient Medications on File Prior to Visit  Medication Sig   Cholecalciferol (VITAMIN D3) 1.25 MG (50000 UT) CAPS Take 1 capsule by mouth once a week.   meloxicam (MOBIC) 7.5 MG tablet Take 7.5 mg by mouth daily as needed.   omeprazole  (PRILOSEC) 20 MG capsule TAKE 1 CAPSULE BY MOUTH EVERY DAY   No current facility-administered medications on file prior to visit.    Allergies:  No Known Allergies  Social History:  Social History   Socioeconomic History   Marital status: Single    Spouse name: Not on file   Number of children: Not on file   Years of education: Not on file   Highest education level: Not on file  Occupational History   Not on file  Tobacco Use   Smoking status: Never   Smokeless tobacco: Never  Vaping Use   Vaping status: Never Used  Substance and Sexual Activity   Alcohol use: Never   Drug use: Never   Sexual activity: Not on file  Other Topics Concern   Not on file  Social History Narrative   Not on file   Social Drivers of Health   Financial Resource Strain: Low Risk  (12/31/2020)   Overall Financial Resource Strain (CARDIA)    Difficulty of Paying Living Expenses: Not hard at all  Food Insecurity: No Food Insecurity (12/31/2020)   Hunger Vital Sign    Worried About Running Out of Food in the Last Year: Never true    Ran Out of Food in the Last Year: Never true  Transportation Needs: No Transportation Needs (12/31/2020)   PRAPARE - Administrator, Civil Service (Medical): No    Lack of Transportation (Non-Medical): No  Physical Activity:  Inactive (12/31/2020)   Exercise Vital Sign    Days of Exercise per Week: 0 days    Minutes of Exercise per Session: 0 min  Stress: No Stress Concern  Present (12/31/2020)   [employer] of Occupational Health - Occupational Stress Questionnaire    Feeling of Stress : Only a little  Social Connections: Socially Isolated (12/31/2020)   Social Connection and Isolation Panel [NHANES]    Frequency of Communication with Friends and Family: Twice a week    Frequency of Social Gatherings with Friends and Family: Twice a week    Attends Religious Services: Never    Database administrator or Organizations: No    Attends Banker Meetings: Never    Marital Status: Separated  Intimate Partner Violence: Not At Risk (12/31/2020)   Humiliation, Afraid, Rape, and Kick questionnaire    Fear of Current or Ex-Partner: No    Emotionally Abused: No    Physically Abused: No    Sexually Abused: No   Social History   Tobacco Use  Smoking Status Never  Smokeless Tobacco Never   Social History   Substance and Sexual Activity  Alcohol Use Never   Family History:  Family History  Problem Relation Age of Onset   Cancer Mother 87       stomach   Hypertension Mother    Aneurysm Sister    GER disease Brother    Breast cancer Neg Hx     Past medical history, surgical history, medications, allergies, family history and social history reviewed with patient today and changes made to appropriate areas of the chart.   ROS All other ROS negative except what is listed above and in the HPI.      Objective:    BP 114/73   Pulse 77   Temp 98.1 F (36.7 C) (Oral)   Ht 5' 7.1" (1.704 m)   Wt 199 lb 6.4 oz (90.4 kg)   LMP 02/21/2023 (Approximate)   SpO2 99%   BMI 31.14 kg/m   Wt Readings from Last 3 Encounters:  03/21/23 199 lb 6.4 oz (90.4 kg)  12/21/22 203 lb 3.2 oz (92.2 kg)  09/17/22 211 lb (95.7 kg)    Physical Exam Vitals and nursing note reviewed. Exam conducted with a  chaperone present.  Constitutional:      General: She is awake. She is not in acute distress.    Appearance: She is well-developed and well-groomed. She is not ill-appearing or toxic-appearing.  HENT:     Head: Normocephalic and atraumatic.     Right Ear: Hearing, tympanic membrane, ear canal and external ear normal. No drainage.     Left Ear: Hearing, tympanic membrane, ear canal and external ear normal. No drainage.     Nose: Nose normal.     Right Sinus: No maxillary sinus tenderness or frontal sinus tenderness.     Left Sinus: No maxillary sinus tenderness or frontal sinus tenderness.     Mouth/Throat:     Mouth: Mucous membranes are moist.     Pharynx: Oropharynx is clear. Uvula midline. No pharyngeal swelling, oropharyngeal exudate or posterior oropharyngeal erythema.  Eyes:     General: Lids are normal.        Right eye: No discharge.        Left eye: No discharge.     Extraocular Movements: Extraocular movements intact.     Conjunctiva/sclera: Conjunctivae normal.     Pupils: Pupils are equal, round, and reactive to light.     Visual Fields: Right eye visual fields normal and left eye visual fields normal.  Neck:     Thyroid: No thyromegaly.     Vascular: No carotid bruit.  Trachea: Trachea normal.  Cardiovascular:     Rate and Rhythm: Normal rate and regular rhythm.     Heart sounds: Normal heart sounds. No murmur heard.    No gallop.  Pulmonary:     Effort: Pulmonary effort is normal. No accessory muscle usage or respiratory distress.     Breath sounds: Normal breath sounds.  Chest:  Breasts:    Right: Normal.     Left: Normal.  Abdominal:     General: Bowel sounds are normal.     Palpations: Abdomen is soft. There is no hepatomegaly or splenomegaly.     Tenderness: There is no abdominal tenderness.     Hernia: There is no hernia in the left inguinal area or right inguinal area.  Genitourinary:    Exam position: Lithotomy position.     Pubic Area: No rash.       Labia:        Right: No rash.        Left: No rash.      Urethra: No prolapse.     Vagina: Normal.     Cervix: Normal.     Uterus: Normal.      Adnexa: Right adnexa normal and left adnexa normal.     Rectum: Normal.     Comments: Cervix anterior and in view.  Pap obtained and sent to lab. Musculoskeletal:        General: Normal range of motion.     Cervical back: Normal range of motion and neck supple.     Right lower leg: No edema.     Left lower leg: No edema.  Lymphadenopathy:     Head:     Right side of head: No submental, submandibular, tonsillar, preauricular or posterior auricular adenopathy.     Left side of head: No submental, submandibular, tonsillar, preauricular or posterior auricular adenopathy.     Cervical: No cervical adenopathy.     Upper Body:     Right upper body: No supraclavicular, axillary or pectoral adenopathy.     Left upper body: No supraclavicular, axillary or pectoral adenopathy.  Skin:    General: Skin is warm and dry.     Capillary Refill: Capillary refill takes less than 2 seconds.     Findings: No rash.  Neurological:     Mental Status: She is alert and oriented to person, place, and time.     Gait: Gait is intact.     Deep Tendon Reflexes: Reflexes are normal and symmetric.     Reflex Scores:      Brachioradialis reflexes are 2+ on the right side and 2+ on the left side.      Patellar reflexes are 2+ on the right side and 2+ on the left side. Psychiatric:        Attention and Perception: Attention normal.        Mood and Affect: Mood normal.        Speech: Speech normal.        Behavior: Behavior normal. Behavior is cooperative.        Thought Content: Thought content normal.        Judgment: Judgment normal.    Results for orders placed or performed in visit on 12/21/22  Basic metabolic panel   Collection Time: 12/21/22  9:55 AM  Result Value Ref Range   Glucose 79 70 - 99 mg/dL   BUN 11 6 - 24 mg/dL   Creatinine, Ser 3.66 0.57 -  1.00 mg/dL  eGFR 82 >59 mL/min/1.73   BUN/Creatinine Ratio 13 9 - 23   Sodium 136 134 - 144 mmol/L   Potassium 3.7 3.5 - 5.2 mmol/L   Chloride 98 96 - 106 mmol/L   CO2 24 20 - 29 mmol/L   Calcium 9.1 8.7 - 10.2 mg/dL      Assessment & Plan:   Problem List Items Addressed This Visit       Cardiovascular and Mediastinum   Essential hypertension - Primary   Chronic, stable.  BP at goal today on exam. Recommend she monitor BP at least a few mornings a week at home and document.  DASH diet at home.  Continue current medication regimen and adjust as needed.  Labs: CBC, CMP, TSH, urine ALB.  Refills sent.       Relevant Medications   hydrochlorothiazide  (HYDRODIURIL ) 25 MG tablet   Other Relevant Orders   CBC with Differential/Platelet   Comprehensive metabolic panel   Lipid Panel w/o Chol/HDL Ratio   TSH   Microalbumin, Urine Waived     Digestive   Gastroesophageal reflux disease without esophagitis   Chronic, improved with Prilosec as needed.  Continue current regimen and adjust as needed.  Educated her on Prilosec and side effects.  If ongoing or worsening consider GI referral. Mag level today.      Relevant Orders   Magnesium     Other   Low hemoglobin   Ongoing and stable.  Noted on rheumatology labs.  Continue multivitamin and B12 at home. Check labs today.      Relevant Orders   CBC with Differential/Platelet   Iron   Ferritin   Menopausal symptoms   Chronic, ongoing -- continue BCP which offers some benefit.  Still having cycles.  Pap today.      Obesity   BMI 31.14, 92 lbs loss total at this time with Wegovy  (started 03/11/21), is tolerating and seeing appetite changes + increased energy levels. Will continue Wegovy , 2.4 MG weekly, discussed with patient at length -- will perform PA if needed as this regimen is offering benefit and she is almost at goal.  Recommended eating smaller high protein, low fat meals more frequently and exercising 30 mins a day 5 times a  week with a goal of 10-15lb weight loss in the next 3 months. Patient voiced their understanding and motivation to adhere to these recommendations.        Relevant Medications   Semaglutide -Weight Management (WEGOVY ) 2.4 MG/0.75ML SOAJ   Positive ANA (antinuclear antibody)   Ongoing.  Noted on labs, followed by rheumatology at this time, continue this collaboration.      Vitamin D  deficiency   Chronic, ongoing.  Noted past labs, continue supplement on reduced level due to recent high Vitamin D  level and recheck today.      Relevant Orders   VITAMIN D  25 Hydroxy (Vit-D Deficiency, Fractures)   Other Visit Diagnoses       Cervical cancer screening       Pap performed and sent to lab.   Relevant Orders   Cytology - PAP     Encounter for annual physical exam       Annual physical today with labs and health maintenance reviewed, discussed with patient.        Follow up plan: Return in about 3 months (around 06/18/2023) for WEIGHT CHECK.   LABORATORY TESTING:  - Pap smear: pap done  IMMUNIZATIONS:   - Tdap: Tetanus vaccination status reviewed: last tetanus booster  within 10 years. - Influenza: Up to date - Pneumovax: Not applicable - Prevnar: Not applicable - COVID: Up to date - HPV: Not applicable - Shingrix vaccine: Up to date  SCREENING: -Mammogram: Up to date next due 12/20/2023 - Colonoscopy: Up to date next due 02/04/2031 - Bone Density: Not applicable  -Hearing Test: Not applicable  -Spirometry: Not applicable   PATIENT COUNSELING:   Advised to take 1 mg of folate supplement per day if capable of pregnancy.   Sexuality: Discussed sexually transmitted diseases, partner selection, use of condoms, avoidance of unintended pregnancy  and contraceptive alternatives.   Advised to avoid cigarette smoking.  I discussed with the patient that most people either abstain from alcohol or drink within safe limits (<=14/week and <=4 drinks/occasion for males, <=7/weeks and  <= 3 drinks/occasion for females) and that the risk for alcohol disorders and other health effects rises proportionally with the number of drinks per week and how often a drinker exceeds daily limits.  Discussed cessation/primary prevention of drug use and availability of treatment for abuse.   Diet: Encouraged to adjust caloric intake to maintain  or achieve ideal body weight, to reduce intake of dietary saturated fat and total fat, to limit sodium intake by avoiding high sodium foods and not adding table salt, and to maintain adequate dietary potassium and calcium preferably from fresh fruits, vegetables, and low-fat dairy products.    Stressed the importance of regular exercise  Injury prevention: Discussed safety belts, safety helmets, smoke detector, smoking near bedding or upholstery.   Dental health: Discussed importance of regular tooth brushing, flossing, and dental visits.    NEXT PREVENTATIVE PHYSICAL DUE IN 1 YEAR. Return in about 3 months (around 06/18/2023) for WEIGHT CHECK.

## 2023-03-21 NOTE — Assessment & Plan Note (Signed)
Chronic, ongoing.  Noted past labs, continue supplement on reduced level due to recent high Vitamin D level and recheck today.

## 2023-03-21 NOTE — Assessment & Plan Note (Signed)
 Ongoing and stable.  Noted on rheumatology labs.  Continue multivitamin and B12 at home. Check labs today.

## 2023-03-21 NOTE — Assessment & Plan Note (Signed)
Chronic, improved with Prilosec as needed.  Continue current regimen and adjust as needed.  Educated her on Prilosec and side effects.  If ongoing or worsening consider GI referral. Mag level today.

## 2023-03-21 NOTE — Assessment & Plan Note (Signed)
 Chronic, ongoing -- continue BCP which offers some benefit.  Still having cycles.  Pap today.

## 2023-03-21 NOTE — Assessment & Plan Note (Signed)
Ongoing.  Noted on labs, followed by rheumatology at this time, continue this collaboration. 

## 2023-03-21 NOTE — Assessment & Plan Note (Signed)
 Chronic, stable.  BP at goal today on exam. Recommend she monitor BP at least a few mornings a week at home and document.  DASH diet at home.  Continue current medication regimen and adjust as needed.  Labs: CBC, CMP, TSH, urine ALB.  Refills sent.

## 2023-03-22 ENCOUNTER — Encounter: Payer: Self-pay | Admitting: Nurse Practitioner

## 2023-03-22 ENCOUNTER — Other Ambulatory Visit: Payer: Self-pay | Admitting: Nurse Practitioner

## 2023-03-22 DIAGNOSIS — E876 Hypokalemia: Secondary | ICD-10-CM

## 2023-03-22 LAB — LIPID PANEL W/O CHOL/HDL RATIO
Cholesterol, Total: 185 mg/dL (ref 100–199)
HDL: 88 mg/dL (ref >39)
LDL Chol Calc (NIH): 79 mg/dL (ref 0–99)
Triglycerides: 105 mg/dL (ref 0–149)
VLDL Cholesterol Cal: 18 mg/dL (ref 5–40)

## 2023-03-22 LAB — CBC WITH DIFFERENTIAL/PLATELET
Basophils Absolute: 0 10*3/uL (ref 0.0–0.2)
Basos: 1 %
EOS (ABSOLUTE): 0 10*3/uL (ref 0.0–0.4)
Eos: 1 %
Hematocrit: 36.4 % (ref 34.0–46.6)
Hemoglobin: 11.4 g/dL (ref 11.1–15.9)
Immature Grans (Abs): 0 10*3/uL (ref 0.0–0.1)
Immature Granulocytes: 0 %
Lymphocytes Absolute: 1.4 10*3/uL (ref 0.7–3.1)
Lymphs: 33 %
MCH: 24.9 pg — ABNORMAL LOW (ref 26.6–33.0)
MCHC: 31.3 g/dL — ABNORMAL LOW (ref 31.5–35.7)
MCV: 80 fL (ref 79–97)
Monocytes Absolute: 0.3 10*3/uL (ref 0.1–0.9)
Monocytes: 6 %
Neutrophils Absolute: 2.6 10*3/uL (ref 1.4–7.0)
Neutrophils: 59 %
Platelets: 196 10*3/uL (ref 150–450)
RBC: 4.57 x10E6/uL (ref 3.77–5.28)
RDW: 13 % (ref 11.7–15.4)
WBC: 4.4 10*3/uL (ref 3.4–10.8)

## 2023-03-22 LAB — MAGNESIUM: Magnesium: 1.7 mg/dL (ref 1.6–2.3)

## 2023-03-22 LAB — COMPREHENSIVE METABOLIC PANEL
ALT: 15 [IU]/L (ref 0–32)
AST: 12 [IU]/L (ref 0–40)
Albumin: 3.9 g/dL (ref 3.8–4.9)
Alkaline Phosphatase: 50 [IU]/L (ref 44–121)
BUN/Creatinine Ratio: 12 (ref 9–23)
BUN: 10 mg/dL (ref 6–24)
Bilirubin Total: 0.6 mg/dL (ref 0.0–1.2)
CO2: 25 mmol/L (ref 20–29)
Calcium: 9 mg/dL (ref 8.7–10.2)
Chloride: 97 mmol/L (ref 96–106)
Creatinine, Ser: 0.84 mg/dL (ref 0.57–1.00)
Globulin, Total: 3.7 g/dL (ref 1.5–4.5)
Glucose: 82 mg/dL (ref 70–99)
Potassium: 3.2 mmol/L — ABNORMAL LOW (ref 3.5–5.2)
Sodium: 135 mmol/L (ref 134–144)
Total Protein: 7.6 g/dL (ref 6.0–8.5)
eGFR: 83 mL/min/{1.73_m2} (ref >59)

## 2023-03-22 LAB — TSH: TSH: 1.24 u[IU]/mL (ref 0.450–4.500)

## 2023-03-22 LAB — VITAMIN D 25 HYDROXY (VIT D DEFICIENCY, FRACTURES): Vit D, 25-Hydroxy: 91.8 ng/mL (ref 30.0–100.0)

## 2023-03-22 LAB — IRON: Iron: 115 ug/dL (ref 27–159)

## 2023-03-22 LAB — FERRITIN: Ferritin: 65 ng/mL (ref 15–150)

## 2023-03-22 NOTE — Progress Notes (Signed)
Contacted via MyChart -- needs lab visit in one week please   Good afternoon La Vita, your labs have returned and overall look wonderful.  The only concern is your potassium is a little low again. The hydrochlorothiazide can cause this and I would recommend cutting this in 1/2 to 12.5 MG since you blood pressure is doing well and potassium is low.  Also increase potassium rich foods in diet: bananas, mangoes, dried fruit, Raisin bran, potatoes, orange juice.  I would like to recheck potassium level in one week via outpatient lab visit.  Any questions? Keep being stellar!!  Thank you for allowing me to participate in your care.  I appreciate you. Kindest regards, Ugonna Keirsey

## 2023-03-23 ENCOUNTER — Encounter: Payer: Self-pay | Admitting: Nurse Practitioner

## 2023-03-24 LAB — CYTOLOGY - PAP
Comment: NEGATIVE
Diagnosis: NEGATIVE
High risk HPV: NEGATIVE

## 2023-03-24 NOTE — Progress Notes (Signed)
Contacted via MyChart   Good afternoon La Vita, your pap returned all normal.  Great news!!  Repeat in 5 years.

## 2023-03-28 ENCOUNTER — Other Ambulatory Visit: Payer: BC Managed Care – PPO

## 2023-04-04 ENCOUNTER — Other Ambulatory Visit: Payer: BC Managed Care – PPO

## 2023-04-04 DIAGNOSIS — E876 Hypokalemia: Secondary | ICD-10-CM

## 2023-04-05 ENCOUNTER — Encounter: Payer: Self-pay | Admitting: Nurse Practitioner

## 2023-04-05 ENCOUNTER — Other Ambulatory Visit: Payer: Self-pay | Admitting: Nurse Practitioner

## 2023-04-05 DIAGNOSIS — E876 Hypokalemia: Secondary | ICD-10-CM

## 2023-04-05 LAB — POTASSIUM: Potassium: 3.2 mmol/L — ABNORMAL LOW (ref 3.5–5.2)

## 2023-04-05 MED ORDER — POTASSIUM CHLORIDE CRYS ER 20 MEQ PO TBCR: 20.0000 meq | EXTENDED_RELEASE_TABLET | Freq: Every day | ORAL | 1 refills | Status: DC

## 2023-04-05 NOTE — Progress Notes (Signed)
 Contacted via MyChart -- needs lab visit only in 6 weeks.   Good afternoon Ruth Hanson your potassium level remains low.  I would recommend we continue a daily potassium supplement, I will send this in.  Then recheck level in 6 weeks.  I also recommend trying to cut your hydrochlorothiazide in 1/2 as this can lower potassium and may be doing this now since you are losing weight. Any questions?

## 2023-04-07 NOTE — Progress Notes (Signed)
 6 week lab appt scheduled.

## 2023-05-19 ENCOUNTER — Other Ambulatory Visit: Payer: BC Managed Care – PPO

## 2023-05-19 ENCOUNTER — Ambulatory Visit: Admitting: Nurse Practitioner

## 2023-05-19 ENCOUNTER — Encounter: Payer: Self-pay | Admitting: Nurse Practitioner

## 2023-05-19 VITALS — BP 126/81 | HR 72 | Temp 98.9°F | Ht 67.0 in | Wt 198.0 lb

## 2023-05-19 DIAGNOSIS — M545 Low back pain, unspecified: Secondary | ICD-10-CM | POA: Insufficient documentation

## 2023-05-19 DIAGNOSIS — E876 Hypokalemia: Secondary | ICD-10-CM | POA: Diagnosis not present

## 2023-05-19 LAB — URINALYSIS, ROUTINE W REFLEX MICROSCOPIC
Bilirubin, UA: NEGATIVE
Glucose, UA: NEGATIVE
Ketones, UA: NEGATIVE
Leukocytes,UA: NEGATIVE
Nitrite, UA: NEGATIVE
Protein,UA: NEGATIVE
Specific Gravity, UA: 1.015 (ref 1.005–1.030)
Urobilinogen, Ur: 0.2 mg/dL (ref 0.2–1.0)
pH, UA: 6.5 (ref 5.0–7.5)

## 2023-05-19 LAB — MICROSCOPIC EXAMINATION

## 2023-05-19 MED ORDER — PREDNISONE 10 MG (21) PO TBPK: ORAL_TABLET | ORAL | 0 refills | Status: DC

## 2023-05-19 MED ORDER — TRIAMCINOLONE ACETONIDE 40 MG/ML IJ SUSP
40.0000 mg | Freq: Once | INTRAMUSCULAR | Status: AC
  Administered 2023-05-19: 40 mg via INTRAMUSCULAR

## 2023-05-19 MED ORDER — CYCLOBENZAPRINE HCL 10 MG PO TABS: 10.0000 mg | ORAL_TABLET | Freq: Three times a day (TID) | ORAL | 0 refills | Status: DC | PRN

## 2023-05-19 NOTE — Progress Notes (Signed)
 BP 126/81   Pulse 72   Temp 98.9 F (37.2 C) (Oral)   Ht 5\' 7"  (1.702 m)   Wt 198 lb (89.8 kg)   SpO2 99%   BMI 31.01 kg/m    Subjective:    Patient ID: Ruth Hanson, female    DOB: [birth year], 53 y.o.   MRN: [id number]  HPI: Ruth Hanson is a 53 y.o. female  Chief Complaint  Patient presents with   Back Pain    Patient states she has been having low back pain for the last 2 weeks. States she thinks she may have done something to it at work as she does a lot of bending for her job.    BACK PAIN Started two weeks ago.  Bought new bed, thinking it was her bed but this did not help.  At work does a lot of bending with the machine, also does pulling.  No recent injuries.  Feels like it is spasm, clenching up in back. Duration: weeks Mechanism of injury: unknown Location: L>R, midline, bilateral, and low back Onset: sudden Severity: 8/10 Quality: dull, aching, pulling, and throbbing Frequency: constant Radiation: none Aggravating factors: lifting, movement, and bending Alleviating factors: heating pad Status: fluctuating Treatments attempted: heating pad  Relief with NSAIDs?: No NSAIDs Taken Nighttime pain:  no Paresthesias / decreased sensation:  no Bowel / bladder incontinence:  no Fevers:  no Dysuria / urinary frequency:  no   Relevant past medical, surgical, family and social history reviewed and updated as indicated. Interim medical history since our last visit reviewed. Allergies and medications reviewed and updated.  Review of Systems  Constitutional:  Negative for activity change, appetite change, diaphoresis, fatigue and fever.  Respiratory:  Negative for cough, chest tightness and shortness of breath.   Cardiovascular:  Negative for chest pain, palpitations and leg swelling.  Gastrointestinal: Negative.   Genitourinary: Negative.   Musculoskeletal:  Positive for back pain.  Neurological: Negative.   Psychiatric/Behavioral: Negative.      Per  HPI unless specifically indicated above     Objective:    BP 126/81   Pulse 72   Temp 98.9 F (37.2 C) (Oral)   Ht 5\' 7"  (1.702 m)   Wt 198 lb (89.8 kg)   SpO2 99%   BMI 31.01 kg/m   Wt Readings from Last 3 Encounters:  05/19/23 198 lb (89.8 kg)  03/21/23 199 lb 6.4 oz (90.4 kg)  12/21/22 203 lb 3.2 oz (92.2 kg)    Physical Exam Vitals and nursing note reviewed.  Constitutional:      General: She is awake. She is not in acute distress.    Appearance: She is well-developed and well-groomed. She is obese. She is not ill-appearing or toxic-appearing.  HENT:     Head: Normocephalic.     Right Ear: Hearing and external ear normal.     Left Ear: Hearing and external ear normal.  Eyes:     General: Lids are normal.        Right eye: No discharge.        Left eye: No discharge.     Conjunctiva/sclera: Conjunctivae normal.     Pupils: Pupils are equal, round, and reactive to light.  Neck:     Thyroid: No thyromegaly.     Vascular: No carotid bruit.  Cardiovascular:     Rate and Rhythm: Normal rate and regular rhythm.     Heart sounds: Normal heart sounds. No murmur heard.  No gallop.  Pulmonary:     Effort: Pulmonary effort is normal. No accessory muscle usage or respiratory distress.     Breath sounds: Normal breath sounds.  Abdominal:     General: Bowel sounds are normal. There is no distension.     Palpations: Abdomen is soft.     Tenderness: There is no abdominal tenderness.  Musculoskeletal:     Cervical back: Normal range of motion and neck supple.     Lumbar back: Spasms (significant tightness midline) and tenderness present. No edema or bony tenderness. Decreased range of motion. Negative right straight leg raise test and negative left straight leg raise test.     Right lower leg: No edema.     Left lower leg: No edema.     Comments: No rashes noted.  Lymphadenopathy:     Cervical: No cervical adenopathy.  Skin:    General: Skin is warm and dry.   Neurological:     Mental Status: She is alert and oriented to person, place, and time.     Deep Tendon Reflexes: Reflexes are normal and symmetric.     Reflex Scores:      Brachioradialis reflexes are 2+ on the right side and 2+ on the left side.      Patellar reflexes are 2+ on the right side and 2+ on the left side. Psychiatric:        Attention and Perception: Attention normal.        Mood and Affect: Mood normal.        Speech: Speech normal.        Behavior: Behavior normal. Behavior is cooperative.        Thought Content: Thought content normal.     Results for orders placed or performed in visit on 04/04/23  Potassium   Collection Time: 04/04/23  3:27 PM  Result Value Ref Range   Potassium 3.2 (L) 3.5 - 5.2 mmol/L      Assessment & Plan:   Problem List Items Addressed This Visit       Other   Acute bilateral low back pain without sciatica - Primary   Acute and present for 2 weeks, does a lot of bending at workplace. No red flags on exam.  Recommend she take Tylenol as needed at home, no more than 4000 MG total daily. Continue heating pad and rest.  Obtain Voltaren gel and Icy/Hot patches over the counter to use.  Kenalog 40 MG provided in office today, educated on this.  Start steroid taper and Flexeril as needed.  Educated on plan of care.  All questions answered.      Relevant Medications   predniSONE (STERAPRED UNI-PAK 21 TAB) 10 MG (21) TBPK tablet   cyclobenzaprine (FLEXERIL) 10 MG tablet   Other Relevant Orders   Urinalysis, Routine w reflex microscopic     Follow up plan: Return if symptoms worsen or fail to improve.

## 2023-05-19 NOTE — Assessment & Plan Note (Signed)
 Acute and present for 2 weeks, does a lot of bending at workplace. No red flags on exam.  Recommend she take Tylenol as needed at home, no more than 4000 MG total daily. Continue heating pad and rest.  Obtain Voltaren gel and Icy/Hot patches over the counter to use.  Kenalog 40 MG provided in office today, educated on this.  Start steroid taper and Flexeril as needed.  Educated on plan of care.  All questions answered.

## 2023-05-19 NOTE — Patient Instructions (Signed)
 Acute Back Pain, Adult Acute back pain is sudden and usually short-lived. It is often caused by an injury to the muscles and tissues in the back. The injury may result from: A muscle, tendon, or ligament getting overstretched or torn. Ligaments are tissues that connect bones to each other. Lifting something improperly can cause a back strain. Wear and tear (degeneration) of the spinal disks. Spinal disks are circular tissue that provide cushioning between the bones of the spine (vertebrae). Twisting motions, such as while playing sports or doing yard work. A hit to the back. Arthritis. You may have a physical exam, lab tests, and imaging tests to find the cause of your pain. Acute back pain usually goes away with rest and home care. Follow these instructions at home: Managing pain, stiffness, and swelling Take over-the-counter and prescription medicines only as told by your health care provider. Treatment may include medicines for pain and inflammation that are taken by mouth or applied to the skin, or muscle relaxants. Your health care provider may recommend applying ice during the first 24-48 hours after your pain starts. To do this: Put ice in a plastic bag. Place a towel between your skin and the bag. Leave the ice on for 20 minutes, 2-3 times a day. Remove the ice if your skin turns bright red. This is very important. If you cannot feel pain, heat, or cold, you have a greater risk of damage to the area. If directed, apply heat to the affected area as often as told by your health care provider. Use the heat source that your health care provider recommends, such as a moist heat pack or a heating pad. Place a towel between your skin and the heat source. Leave the heat on for 20-30 minutes. Remove the heat if your skin turns bright red. This is especially important if you are unable to feel pain, heat, or cold. You have a greater risk of getting burned. Activity  Do not stay in bed. Staying in  bed for more than 1-2 days can delay your recovery. Sit up and stand up straight. Avoid leaning forward when you sit or hunching over when you stand. If you work at a desk, sit close to it so you do not need to lean over. Keep your chin tucked in. Keep your neck drawn back, and keep your elbows bent at a 90-degree angle (right angle). Sit high and close to the steering wheel when you drive. Add lower back (lumbar) support to your car seat, if needed. Take short walks on even surfaces as soon as you are able. Try to increase the length of time you walk each day. Do not sit, drive, or stand in one place for more than 30 minutes at a time. Sitting or standing for long periods of time can put stress on your back. Do not drive or use heavy machinery while taking prescription pain medicine. Use proper lifting techniques. When you bend and lift, use positions that put less stress on your back: [place] your knees. Keep the load close to your body. Avoid twisting. Exercise regularly as told by your health care provider. Exercising helps your back heal faster and helps prevent back injuries by keeping muscles strong and flexible. Work with a physical therapist to make a safe exercise program, as recommended by your health care provider. Do any exercises as told by your physical therapist. Lifestyle Maintain a healthy weight. Extra weight puts stress on your back and makes it difficult to have good  posture. Avoid activities or situations that make you feel anxious or stressed. Stress and anxiety increase muscle tension and can make back pain worse. Learn ways to manage anxiety and stress, such as through exercise. General instructions Sleep on a firm mattress in a comfortable position. Try lying on your side with your knees slightly bent. If you lie on your back, put a pillow under your knees. Keep your head and neck in a straight line with your spine (neutral position) when using electronic equipment like  smartphones or pads. To do this: Raise your smartphone or pad to look at it instead of bending your head or neck to look down. Put the smartphone or pad at the level of your face while looking at the screen. Follow your treatment plan as told by your health care provider. This may include: Cognitive or behavioral therapy. Acupuncture or massage therapy. Meditation or yoga. Contact a health care provider if: You have pain that is not relieved with rest or medicine. You have increasing pain going down into your legs or buttocks. Your pain does not improve after 2 weeks. You have pain at night. You lose weight without trying. You have a fever or chills. You develop nausea or vomiting. You develop abdominal pain. Get help right away if: You develop new bowel or bladder control problems. You have unusual weakness or numbness in your arms or legs. You feel faint. These symptoms may represent a serious problem that is an emergency. Do not wait to see if the symptoms will go away. Get medical help right away. Call your local emergency services (911 in the [place] Do not drive yourself to the hospital. Summary Acute back pain is sudden and usually short-lived. Use proper lifting techniques. When you bend and lift, use positions that put less stress on your back. Take over-the-counter and prescription medicines only as told by your health care provider, and apply heat or ice as told. This information is not intended to replace advice given to you by your health care provider. Make sure you discuss any questions you have with your health care provider. Document Revised: 04/18/2020 Document Reviewed: 04/18/2020 Elsevier Patient Education  2024 [employer].

## 2023-05-20 ENCOUNTER — Encounter: Payer: Self-pay | Admitting: Nurse Practitioner

## 2023-05-20 LAB — POTASSIUM: Potassium: 3.8 mmol/L (ref 3.5–5.2)

## 2023-05-20 NOTE — Progress Notes (Signed)
 Contacted via MyChart   Potassium level improved, no further supplements needed.  Focus on potassium rich foods in diet:) Hope back is doing okay. Any questions? Keep being amazing!!  Thank you for allowing me to participate in your care.  I appreciate you. Kindest regards, Chivas Notz

## 2023-05-27 IMAGING — US US ABDOMEN LIMITED
1 series · 15 of 25 positions shown · non-contrast
Comparison: None.

CLINICAL DATA: Acute right upper quadrant abdominal pain.

EXAM:
ULTRASOUND ABDOMEN LIMITED RIGHT UPPER QUADRANT

[Series 1: us abdomen limited ruq · 15 of 45 slices shown]
[im 1/45]
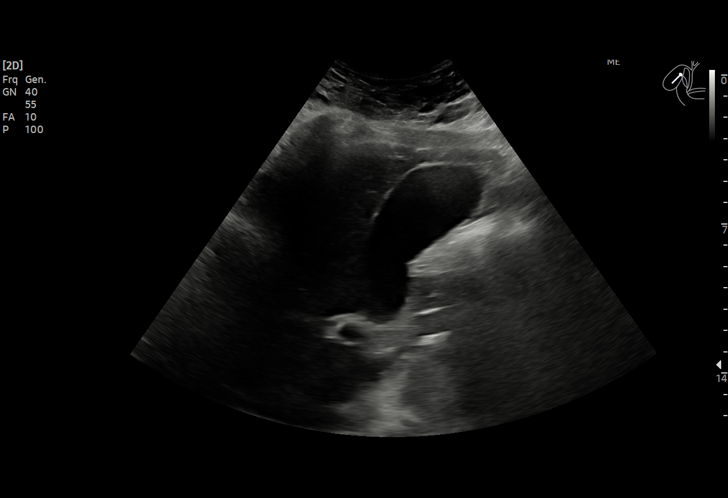
[im 4/45]
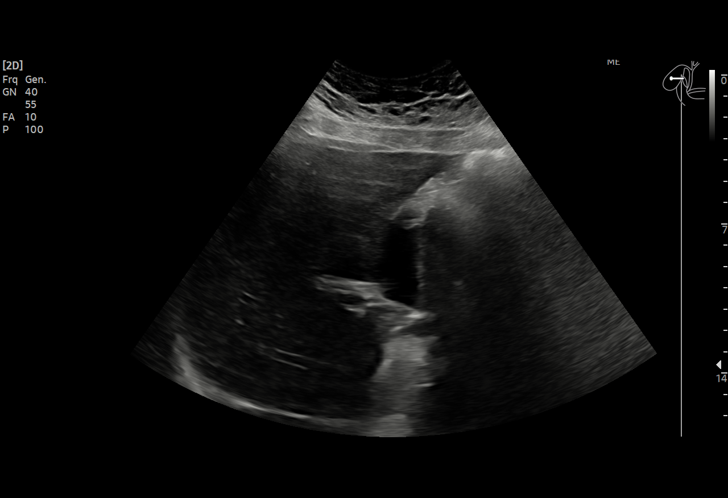
[im 8/45]
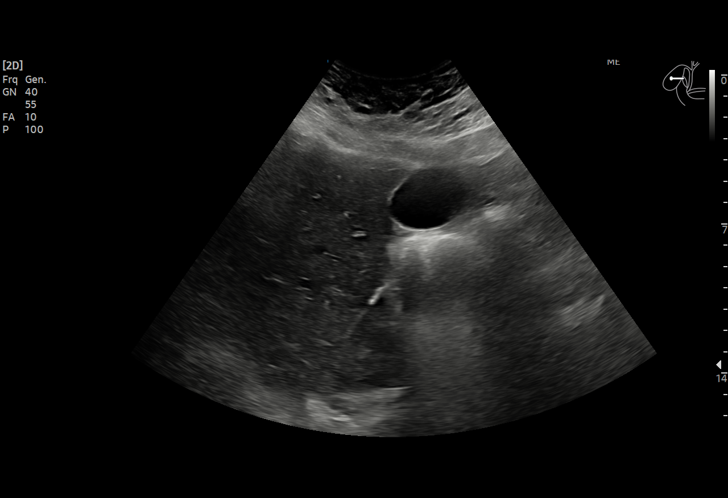
[im 10/45]
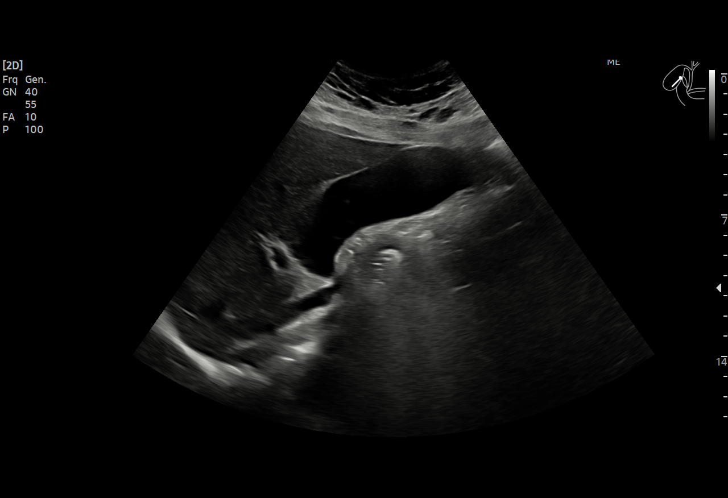
[im 13/45]
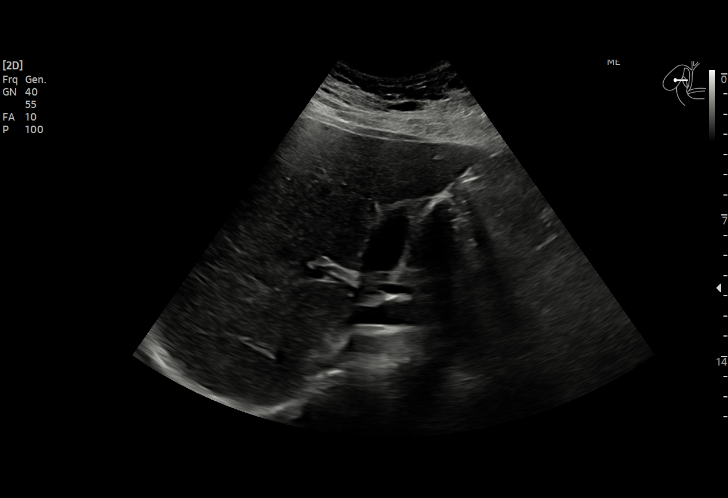
[im 17/45]
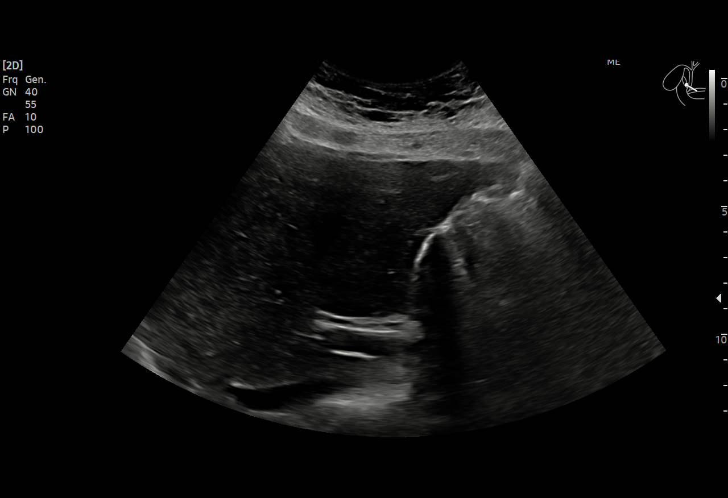
[im 19/45]
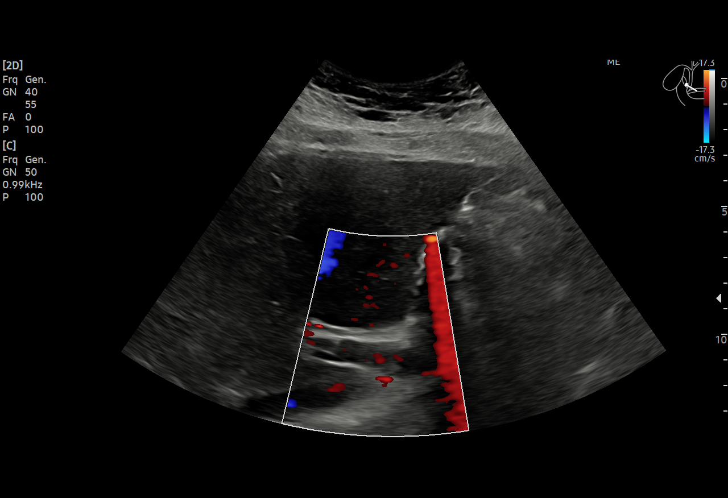
[im 23/45]
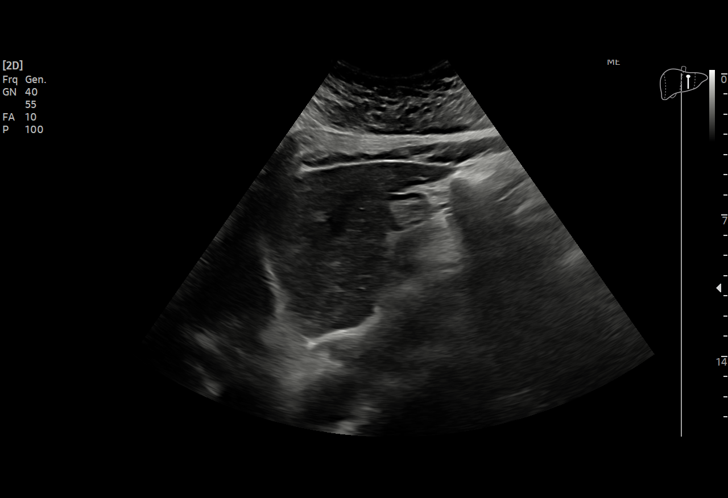
[im 26/45]
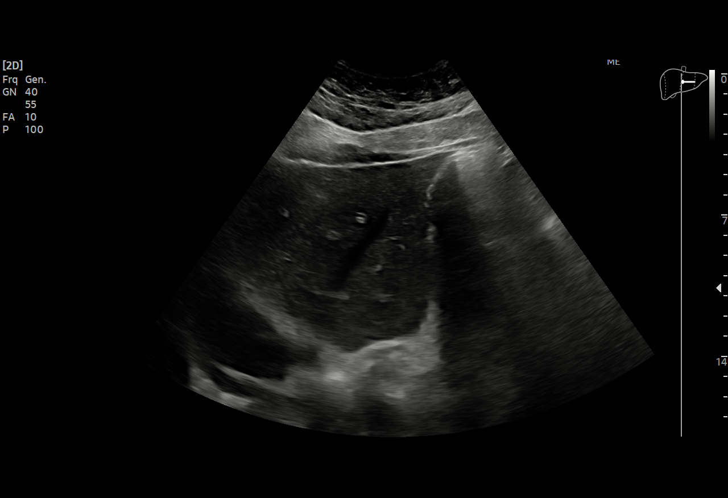
[im 28/45]
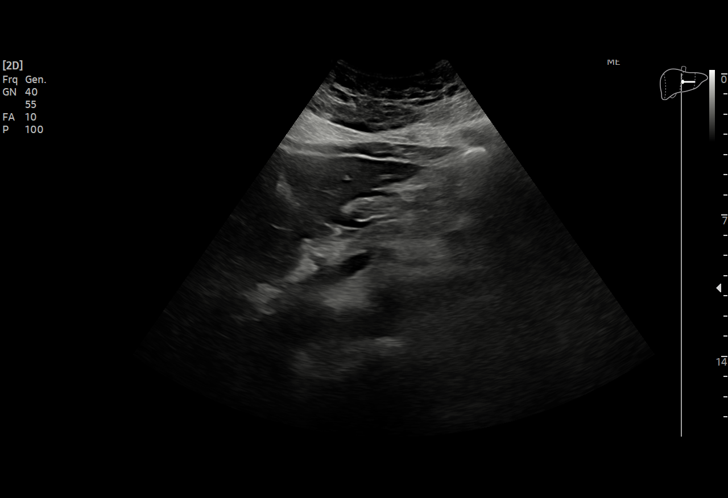
[im 32/45]
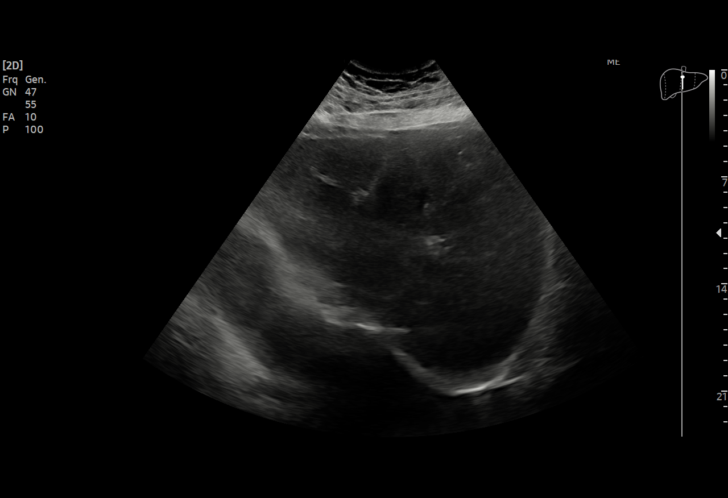
[im 35/45]
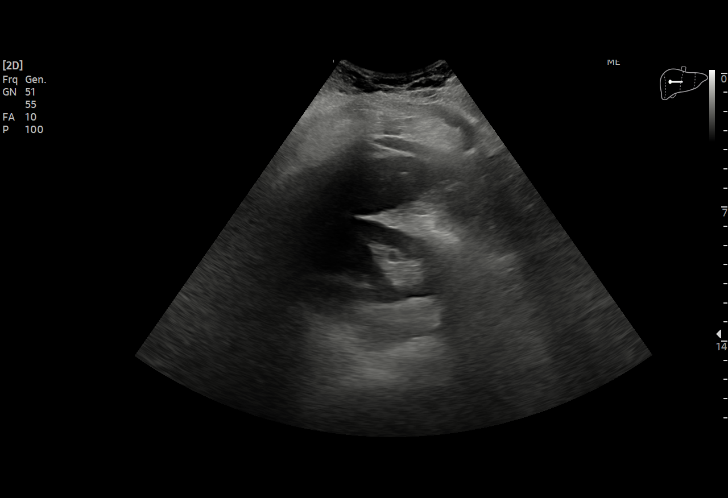
[im 37/45]
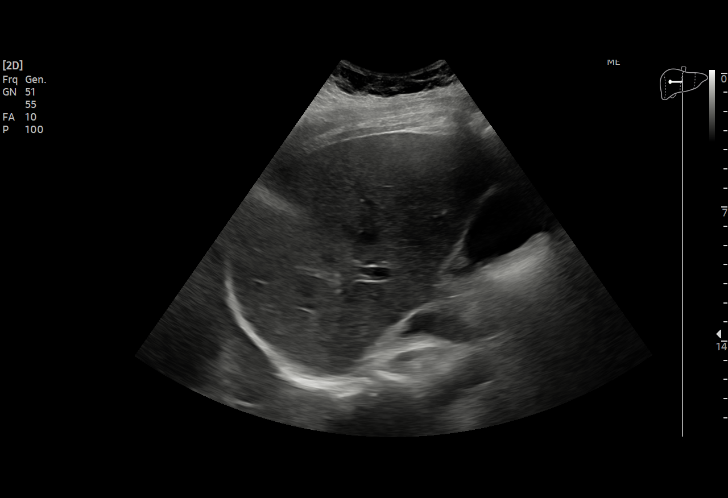
[im 41/45]
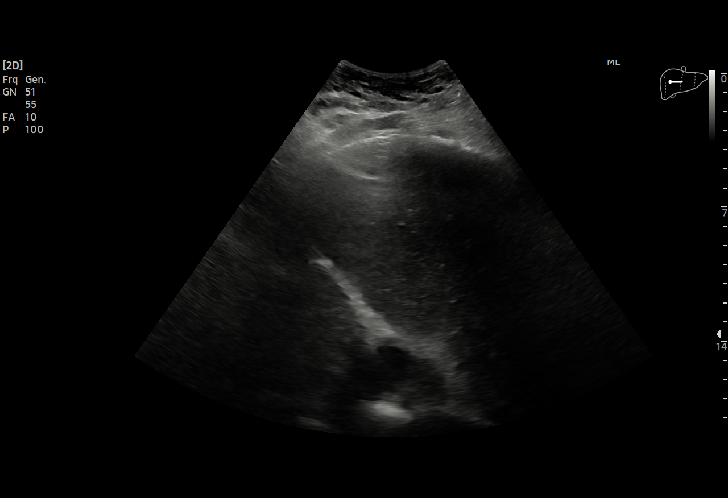
[im 45/45]
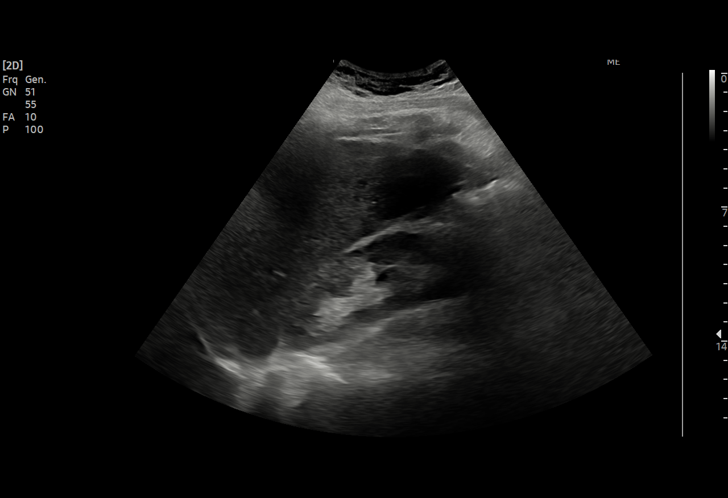

[15 of 25 positions shown; findings below may reference images not displayed]

FINDINGS: Gallbladder:

No gallstones or wall thickening visualized. No sonographic Murphy
sign noted by sonographer.

Common bile duct:

Diameter: 3 mm which is within normal limits.

Liver:

No focal lesion identified. Within normal limits in parenchymal
echogenicity. Portal vein is patent on color Doppler imaging with
normal direction of blood flow towards the liver.

Other: None.
IMPRESSION: No definite abnormality seen in the right upper quadrant of the
abdomen.

## 2023-05-27 IMAGING — DX DG CHEST 1V PORT
1 series · 1 of 1 positions shown · non-contrast
Comparison: None.

CLINICAL DATA: Right-sided chest pain.

EXAM:
PORTABLE CHEST 1 VIEW

[chest ap]
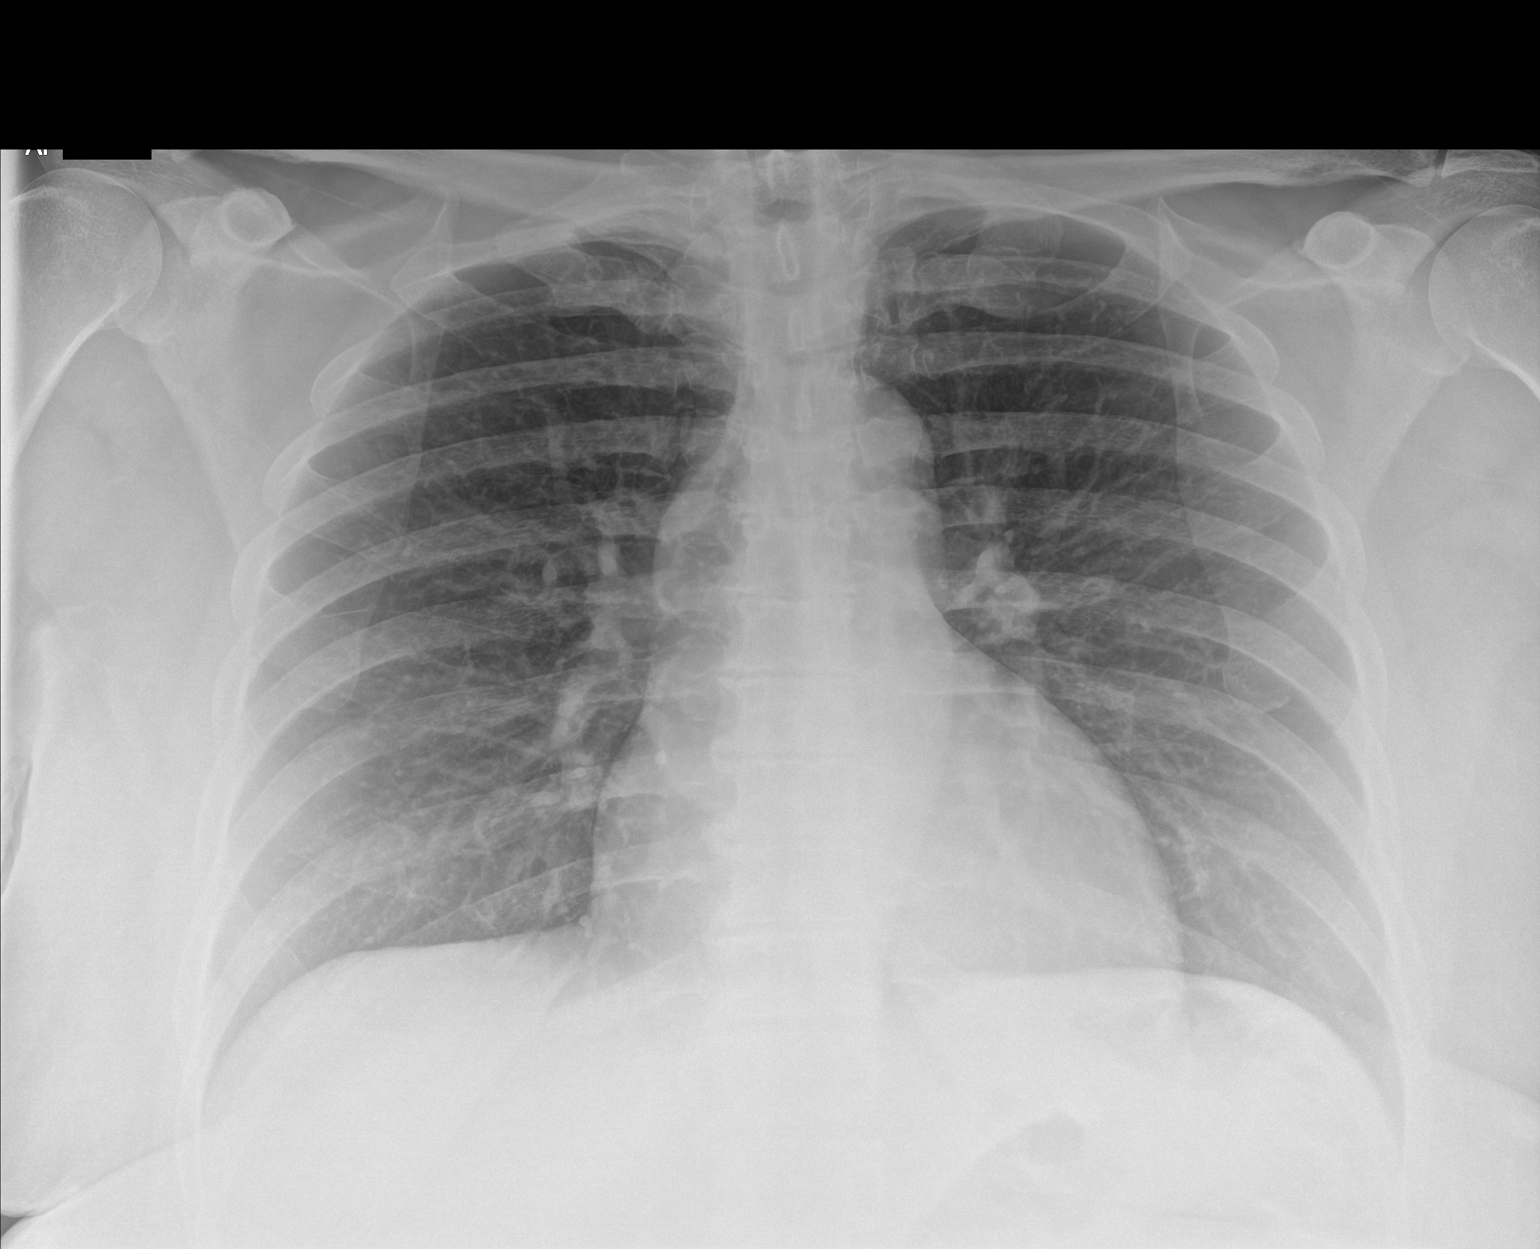

[1 of 1 positions shown; findings below may reference images not displayed]

FINDINGS: The heart size and mediastinal contours are within normal limits.
Both lungs are clear. The visualized skeletal structures are
unremarkable.
IMPRESSION: No active disease.

## 2023-06-18 NOTE — Patient Instructions (Signed)
 Be Involved in Caring For Your Health:  Taking Medications When medications are taken as directed, they can greatly improve your health. But if they are not taken as prescribed, they may not work. In some cases, not taking them correctly can be harmful. To help ensure your treatment remains effective and safe, understand your medications and how to take them. Bring your medications to each visit for review by your provider.  Your lab results, notes, and after visit summary will be available on My Chart. We strongly encourage you to use this feature. If lab results are abnormal the clinic will contact you with the appropriate steps. If the clinic does not contact you assume the results are satisfactory. You can always view your results on My Chart. If you have questions regarding your health or results, please contact the clinic during office hours. You can also ask questions on My Chart.  We at [facility] are grateful that you chose [place] to provide your care. We strive to provide evidence-based and compassionate care and are always looking for feedback. If you get a survey from the clinic please complete this so we can hear your opinions.  Healthy Eating, Adult Healthy eating may help you get and keep a healthy body weight, reduce the risk of chronic disease, and live a long and productive life. It is important to follow a healthy eating pattern. Your nutritional and calorie needs should be met mainly by different nutrient-rich foods. What are tips for following this plan? Reading food labels Read labels and choose the following: Reduced or low sodium products. Juices with 100% fruit juice. Foods with low saturated fats (<3 g per serving) and high polyunsaturated and monounsaturated fats. Foods with whole grains, such as whole wheat, cracked wheat, brown rice, and wild rice. Whole grains that are fortified with folic acid. This is recommended for females who are pregnant or who want  to become pregnant. Read labels and do not eat or drink the following: Foods or drinks with added sugars. These include foods that contain brown sugar, corn sweetener, corn syrup, dextrose, fructose, glucose, high-fructose corn syrup, honey, invert sugar, lactose, malt syrup, maltose, molasses, raw sugar, sucrose, trehalose, or turbinado sugar. Limit your intake of added sugars to less than 10% of your total daily calories. Do not eat more than the following amounts of added sugar per day: 6 teaspoons (25 g) for females. 9 teaspoons (38 g) for males. Foods that contain processed or refined starches and grains. Refined grain products, such as white flour, degermed cornmeal, white bread, and white rice. Shopping Choose nutrient-rich snacks, such as vegetables, whole fruits, and nuts. Avoid high-calorie and high-sugar snacks, such as potato chips, fruit snacks, and candy. Use oil-based dressings and spreads on foods instead of solid fats such as butter, margarine, sour cream, or cream cheese. Limit pre-made sauces, mixes, and "instant" products such as flavored rice, instant noodles, and ready-made pasta. Try more plant-protein sources, such as tofu, tempeh, black beans, edamame, lentils, nuts, and seeds. Explore eating plans such as the Mediterranean diet or vegetarian diet. Try heart-healthy dips made with beans and healthy fats like hummus and guacamole. Vegetables go great with these. Cooking Use oil to saut or stir-fry foods instead of solid fats such as butter, margarine, or lard. Try baking, boiling, grilling, or broiling instead of frying. Remove the fatty part of meats before cooking. Steam vegetables in water or broth. Meal planning  At meals, imagine dividing your plate into fourths: One-half of  your plate is fruits and vegetables. One-fourth of your plate is whole grains. One-fourth of your plate is protein, especially lean meats, poultry, eggs, tofu, beans, or nuts. Include  low-fat dairy as part of your daily diet. Lifestyle Choose healthy options in all settings, including home, work, school, restaurants, or stores. Prepare your food safely: Wash your hands after handling raw meats. Where you prepare food, keep surfaces clean by regularly washing with hot, soapy water. Keep raw meats separate from ready-to-eat foods, such as fruits and vegetables. Cook seafood, meat, poultry, and eggs to the recommended temperature. Get a food thermometer. Store foods at safe temperatures. In general: Keep cold foods at 76F (4.4C) or below. Keep hot foods at 176F (60C) or above. Keep your freezer at [facility] (-17.8C) or below. Foods are not safe to eat if they have been between the temperatures of 40-176F (4.4-60C) for more than 2 hours. What foods should I eat? Fruits Aim to eat 1-2 cups of fresh, canned (in natural juice), or frozen fruits each day. One cup of fruit equals 1 small apple, 1 large banana, 8 large strawberries, 1 cup (237 g) canned fruit,  cup (82 g) dried fruit, or 1 cup (240 mL) 100% juice. Vegetables Aim to eat 2-4 cups of fresh and frozen vegetables each day, including different varieties and colors. One cup of vegetables equals 1 cup (91 g) broccoli or cauliflower florets, 2 medium carrots, 2 cups (150 g) raw, leafy greens, 1 large tomato, 1 large bell pepper, 1 large sweet potato, or 1 medium white potato. Grains Aim to eat 5-10 ounce-equivalents of whole grains each day. Examples of 1 ounce-equivalent of grains include 1 slice of bread, 1 cup (40 g) ready-to-eat cereal, 3 cups (24 g) popcorn, or  cup (93 g) cooked rice. Meats and other proteins Try to eat 5-7 ounce-equivalents of protein each day. Examples of 1 ounce-equivalent of protein include 1 egg,  oz nuts (12 almonds, 24 pistachios, or 7 walnut halves), 1/4 cup (90 g) cooked beans, 6 tablespoons (90 g) hummus or 1 tablespoon (16 g) peanut butter. A cut of meat or fish that is the size of a deck  of cards is about 3-4 ounce-equivalents (85 g). Of the protein you eat each week, try to have at least 8 sounce (227 g) of seafood. This is about 2 servings per week. This includes salmon, trout, herring, sardines, and anchovies. Dairy Aim to eat 3 cup-equivalents of fat-free or low-fat dairy each day. Examples of 1 cup-equivalent of dairy include 1 cup (240 mL) milk, 8 ounces (250 g) yogurt, 1 ounces (44 g) natural cheese, or 1 cup (240 mL) fortified soy milk. Fats and oils Aim for about 5 teaspoons (21 g) of fats and oils per day. Choose monounsaturated fats, such as canola and olive oils, mayonnaise made with olive oil or avocado oil, avocados, peanut butter, and most nuts, or polyunsaturated fats, such as sunflower, corn, and soybean oils, walnuts, pine nuts, sesame seeds, sunflower seeds, and flaxseed. Beverages Aim for 6 eight-ounce glasses of water per day. Limit coffee to 3-5 eight-ounce cups per day. Limit caffeinated beverages that have added calories, such as soda and energy drinks. If you drink alcohol: Limit how much you have to: 0-1 drink a day if you are female. 0-2 drinks a day if you are female. Know how much alcohol is in your drink. In the [place] one drink is one 12 oz bottle of beer (355 mL), one 5 oz glass of wine (  148 mL), or one 1 oz glass of hard liquor (44 mL). Seasoning and other foods Try not to add too much salt to your food. Try using herbs and spices instead of salt. Try not to add sugar to food. This information is based on [place] nutrition guidelines. To learn more, visit [employer]. Exact amounts may vary. You may need different amounts. This information is not intended to replace advice given to you by your health care provider. Make sure you discuss any questions you have with your health care provider. Document Revised: 10/26/2021 Document Reviewed: 10/26/2021 Elsevier Patient Education  2024 [employer].

## 2023-06-20 ENCOUNTER — Ambulatory Visit (INDEPENDENT_AMBULATORY_CARE_PROVIDER_SITE_OTHER): Payer: BC Managed Care – PPO | Admitting: Nurse Practitioner

## 2023-06-20 ENCOUNTER — Encounter: Payer: Self-pay | Admitting: Nurse Practitioner

## 2023-06-20 VITALS — BP 118/79 | HR 88 | Temp 98.2°F | Ht 67.0 in | Wt 210.0 lb

## 2023-06-20 DIAGNOSIS — E66811 Obesity, class 1: Secondary | ICD-10-CM

## 2023-06-20 DIAGNOSIS — N951 Menopausal and female climacteric states: Secondary | ICD-10-CM | POA: Diagnosis not present

## 2023-06-20 DIAGNOSIS — E6609 Other obesity due to excess calories: Secondary | ICD-10-CM

## 2023-06-20 DIAGNOSIS — Z6832 Body mass index (BMI) 32.0-32.9, adult: Secondary | ICD-10-CM | POA: Diagnosis not present

## 2023-06-20 MED ORDER — GABAPENTIN 100 MG PO CAPS: 100.0000 mg | ORAL_CAPSULE | Freq: Every day | ORAL | 3 refills | Status: AC

## 2023-06-20 NOTE — Progress Notes (Signed)
 BP 118/79   Pulse 88   Temp 98.2 F (36.8 C) (Oral)   Ht 5\' 7"  (1.702 m)   Wt 210 lb (95.3 kg)   LMP 06/19/2023 (Exact Date)   SpO2 96%   BMI 32.89 kg/m    Subjective:    Patient ID: Ruth Hanson, female    DOB: Mar 05, 1970, 53 y.o.   MRN: [id number]  HPI: Ruth Ruth Hanson is a 53 y.o. female  Chief Complaint  Patient presents with   Weight Check   Menstrual Problem    Patient states is continuing to have back pain when her cycle is coming. States she has also been noticing an increase in hot flashes as well    WEIGHT GAIN Continues on Wegovy  2.4 MG weekly, which was started on 03/11/2021 when she was 291 lbs. Her goal is to get to 190 - 195 lbs.  Had lost 92 lbs, but since recent visit has gained 12 lbs. She is bloated at present, due to cycle being present. Duration: chronic Previous attempts at weight loss: yes Complications of obesity: HTN/HLD Peak weight: 291 lbs Weight loss goal: 190 to 195 lbs Weight loss to date: 80 lbs, had been 92 lbs Requesting obesity pharmacotherapy: yes Current weight loss supplements/medications: yes Previous weight loss supplements/meds: yes Calories:  2000  MENOPAUSAL SYMPTOMS Reports cramps/back pain with cycles are getting worse and having hot flashes.  Increased fatigue also reported.  Continues on [name] , cycles continue to be heavy and darker.  Tried 300 MG Gabapentin  in past for menopausal symptoms but made her bit groggy & 600 MG gave her headaches. Gravida/Para: 2/2 Duration: stable Symptom severity: mild Hot flashes: yes Night sweats: yes Sleep disturbances: yes Vaginal dryness: no Dyspareunia:no Decreased libido: yes Emotional lability: yes Stress incontinence: no Previous HRT/pharmacotherapy: yes Hysterectomy: no Average interval between menses: 30 days Length of menses: 5 days Flow: heavy for 1st two days, then gets lighter Dysmenorrhea: yes GYN surgery: none Absolute Contraindications to Hormonal Therapy:      Undiagnosed vaginal bleeding: no    Breast cancer: no    Endometrial cancer: no    Coronary disease: no    Cerebrovascular disease: no    Venous thromboembolic disease: no   Relevant past medical, surgical, family and social history reviewed and updated as indicated. Interim medical history since our last visit reviewed. Allergies and medications reviewed and updated.  Review of Systems  Constitutional:  Negative for activity change, appetite change, diaphoresis, fatigue and fever.  HENT: Negative.    Respiratory:  Negative for cough, chest tightness, shortness of breath and wheezing.   Cardiovascular:  Negative for chest pain, palpitations and leg swelling.  Gastrointestinal: Negative.   Neurological: Negative.   Psychiatric/Behavioral: Negative.      Per HPI unless specifically indicated above     Objective:     BP 118/79   Pulse 88   Temp 98.2 F (36.8 C) (Oral)   Ht 5\' 7"  (1.702 m)   Wt 210 lb (95.3 kg)   LMP 06/19/2023 (Exact Date)   SpO2 96%   BMI 32.89 kg/m   Wt Readings from Last 3 Encounters:  06/20/23 210 lb (95.3 kg)  05/19/23 198 lb (89.8 kg)  03/21/23 199 lb 6.4 oz (90.4 kg)    Physical Exam Vitals and nursing note reviewed.  Constitutional:      General: She is awake. She is not in acute distress.    Appearance: She is well-developed and well-groomed. She is  obese. She is not ill-appearing or toxic-appearing.  HENT:     Head: Normocephalic.     Right Ear: Hearing normal.     Left Ear: Hearing normal.  Eyes:     General: Lids are normal.        Right eye: No discharge.        Left eye: No discharge.     Conjunctiva/sclera: Conjunctivae normal.     Pupils: Pupils are equal, round, and reactive to light.  Neck:     Thyroid: No thyromegaly.     Vascular: No carotid bruit.  Cardiovascular:     Rate and Rhythm: Normal rate and regular rhythm.     Heart sounds: Normal heart sounds. No murmur heard.    No gallop.  Pulmonary:     Effort: Pulmonary  effort is normal. No accessory muscle usage or respiratory distress.     Breath sounds: Normal breath sounds.  Abdominal:     General: Bowel sounds are normal. There is no distension.     Palpations: Abdomen is soft.     Tenderness: There is no abdominal tenderness.     Hernia: No hernia is present.  Musculoskeletal:     Cervical back: Normal range of motion and neck supple.     Lumbar back: Normal.     Right lower leg: No edema.     Left lower leg: No edema.  Lymphadenopathy:     Cervical: No cervical adenopathy.  Skin:    General: Skin is warm and dry.  Neurological:     Mental Status: She is alert and oriented to person, place, and time.  Psychiatric:        Attention and Perception: Attention normal.        Mood and Affect: Mood normal.        Behavior: Behavior normal. Behavior is cooperative.        Thought Content: Thought content normal.        Judgment: Judgment normal.     Results for orders placed or performed in visit on 05/19/23  Microscopic Examination   Collection Time: 05/19/23  9:01 AM   Urine  Result Value Ref Range   WBC, UA 0-5 0 - 5 /hpf   RBC, Urine 0-2 0 - 2 /hpf   Epithelial Cells (non renal) 0-10 0 - 10 /hpf   Bacteria, UA Few None seen/Few  Urinalysis, Routine w reflex microscopic   Collection Time: 05/19/23  9:01 AM  Result Value Ref Range   Specific Gravity, UA 1.015 1.005 - 1.030   pH, UA 6.5 5.0 - 7.5   Color, UA Yellow Yellow   Appearance Ur Clear Clear   Leukocytes,UA Negative Negative   Protein,UA Negative Negative/Trace   Glucose, UA Negative Negative   Ketones, UA Negative Negative   RBC, UA Trace (A) Negative   Bilirubin, UA Negative Negative   Urobilinogen, Ur 0.2 0.2 - 1.0 mg/dL   Nitrite, UA Negative Negative   Microscopic Examination See below:       Assessment & Plan:   Problem List Items Addressed This Visit       Other   Obesity - Primary   BMI 31.14, 92 lbs loss total, but did gain 12 pounds since last visit  however is bloated at this time due to cycle (started Wegovy  03/11/21), is tolerating. Will continue Wegovy , 2.4 MG weekly, discussed with patient at length -- will perform PA if needed as this regimen is offering benefit and she is  almost at goal.  Recommended eating smaller high protein, low fat meals more frequently and exercising 30 mins a day 5 times a week with a goal of 10-15lb weight loss in the next 3 months. Patient voiced their understanding and motivation to adhere to these recommendations.        Menopausal symptoms   Chronic, ongoing -- continue BCP which offers some benefit.  Still having cycles, although these are changing.  Tried 300 and 600 MG Gabapentin  in past. The 300 worked well, but made groggy.  Will trial 100 MG, lowest dose, at night to see if benefit to menopausal symptoms but less fatigue.  If ongoing side effects then could trial Clonidine or Lyrica. Could consider Veozah, however would need to monitor LFTs.        Follow up plan: Return in about 4 weeks (around 07/18/2023) for Menopause virtual visit.

## 2023-06-20 NOTE — Assessment & Plan Note (Signed)
 BMI 31.14, 92 lbs loss total, but did gain 12 pounds since last visit however is bloated at this time due to cycle (started Wegovy  03/11/21), is tolerating. Will continue Wegovy , 2.4 MG weekly, discussed with patient at length -- will perform PA if needed as this regimen is offering benefit and she is almost at goal.  Recommended eating smaller high protein, low fat meals more frequently and exercising 30 mins a day 5 times a week with a goal of 10-15lb weight loss in the next 3 months. Patient voiced their understanding and motivation to adhere to these recommendations.

## 2023-06-20 NOTE — Assessment & Plan Note (Addendum)
 Chronic, ongoing -- continue BCP which offers some benefit.  Still having cycles, although these are changing.  Tried 300 and 600 MG Gabapentin  in past. The 300 worked well, but made groggy.  Will trial 100 MG, lowest dose, at night to see if benefit to menopausal symptoms but less fatigue.  If ongoing side effects then could trial Clonidine or Lyrica. Could consider Veozah, however would need to monitor LFTs.

## 2023-06-30 ENCOUNTER — Other Ambulatory Visit: Payer: Self-pay | Admitting: Nurse Practitioner

## 2023-07-01 NOTE — Telephone Encounter (Signed)
 Requested Prescriptions  Pending Prescriptions Disp Refills   potassium chloride  SA (KLOR-CON  M) 20 MEQ tablet [Pharmacy Med Name: POTASSIUM CL ER 20 MEQ TAB MCR] 90 tablet 0    Sig: TAKE 1 TABLET BY MOUTH EVERY DAY     Endocrinology:  Minerals - Potassium Supplementation Passed - 07/01/2023  9:29 AM      Passed - K in normal range and within 360 days    Potassium  Date Value Ref Range Status  05/19/2023 3.8 3.5 - 5.2 mmol/L Final         Passed - Cr in normal range and within 360 days    Creatinine, Ser  Date Value Ref Range Status  03/21/2023 0.84 0.57 - 1.00 mg/dL Final         Passed - Valid encounter within last 12 months    Recent Outpatient Visits           1 week ago Class 1 obesity due to excess calories with serious comorbidity and body mass index (BMI) of 32.0 to 32.9 in adult   [place] [facility] [place], [place] T, NP   1 month ago Acute bilateral low back pain without sciatica   [place] [facility] [place], Sanjuan Crumbly T, NP   3 months ago Essential hypertension    [facility] [place], Lavelle Posey, NP

## 2023-07-04 ENCOUNTER — Other Ambulatory Visit: Payer: Self-pay | Admitting: Nurse Practitioner

## 2023-07-07 NOTE — Telephone Encounter (Signed)
 Requested medications are due for refill today.  yes  Requested medications are on the active medications list.  yes  Last refill. 05/19/2023 #45 0 rf  Future visit scheduled.   yes  Notes to clinic.  Refill not delegated.    Requested Prescriptions  Pending Prescriptions Disp Refills   cyclobenzaprine  (FLEXERIL ) 10 MG tablet [Pharmacy Med Name: CYCLOBENZAPRINE  10 MG TABLET] 45 tablet 0    Sig: TAKE 1 TABLET BY MOUTH THREE TIMES A DAY AS NEEDED FOR MUSCLE SPASMS     Not Delegated - Analgesics:  Muscle Relaxants Failed - 07/07/2023 12:32 PM      Failed - This refill cannot be delegated      Passed - Valid encounter within last 6 months    Recent Outpatient Visits           2 weeks ago Class 1 obesity due to excess calories with serious comorbidity and body mass index (BMI) of 32.0 to 32.9 in adult   [place] [facility] [place], [place] T, NP   1 month ago Acute bilateral low back pain without sciatica   [place] [facility] [place], Sanjuan Crumbly T, NP   3 months ago Essential hypertension   [place] [facility] [place], Lavelle Posey, NP

## 2023-07-18 ENCOUNTER — Ambulatory Visit: Admitting: Nurse Practitioner

## 2023-07-29 NOTE — Patient Instructions (Incomplete)
 Be Involved in Caring For Your Health:  Taking Medications When medications are taken as directed, they can greatly improve your health. But if they are not taken as prescribed, they may not work. In some cases, not taking them correctly can be harmful. To help ensure your treatment remains effective and safe, understand your medications and how to take them. Bring your medications to each visit for review by your provider.  Your lab results, notes, and after visit summary will be available on My Chart. We strongly encourage you to use this feature. If lab results are abnormal the clinic will contact you with the appropriate steps. If the clinic does not contact you assume the results are satisfactory. You can always view your results on My Chart. If you have questions regarding your health or results, please contact the clinic during office hours. You can also ask questions on My Chart.  We at [facility] are grateful that you chose us  to provide your care. We strive to provide evidence-based and compassionate care and are always looking for feedback. If you get a survey from the clinic please complete this so we can hear your opinions.  Menopause: What to Know Menopause is the time in your life when your menstrual periods stop. It marks the end of your ability to get pregnant. It can be defined as not having a period for 12 months without another medical cause. The time when you start to move into menopause is called perimenopause. It often happens between ages 48-55. It can last for many years. During perimenopause, hormone levels change in your body. This can cause symptoms and affect your health. Menopause may make you more likely to have: Bones that are weak and break more easily. Depression. This is when you feel sad or hopeless. Arteries that harden and get narrow. These can cause heart attacks and strokes. What are the causes? In most cases, menopause is a natural change to  your body and hormone levels that happens as you get older. But in some cases, it may be caused by changes that aren't natural. These include: Surgery to take out both ovaries. Side effects from some medicines. What increases the risk? You're more likely to go through menopause early if: You have an abnormal growth (tumor) of the pituitary gland in your brain. You have a disease that affects your ovaries. You've had certain treatments for cancer. These include: Chemotherapy. Hormone therapy. Radiation therapy on the area between your hips (pelvis). You smoke a lot or drink a lot of alcohol. Other people in your family have gone through menopause early. You're very thin. What are the signs or symptoms? You may have: Hot flashes. Irregular periods. Night sweats. Changes in how you feel about sex. You may: Have less of a sex drive. Feel more discomfort around your sexuality. Vaginal dryness and thinning of the vaginal walls. This may make it hurt to have sex. Skin changes, such as: Dry skin. New wrinkles. Headaches. Other symptoms may include: Trouble sleeping. Mood swings. Memory problems. Weight gain. Hair growth on your face and chest. Bladder infections or trouble peeing. How is this diagnosed? You may be diagnosed based on: Your medical history. An exam. Your age. Your history of menstrual periods. Your symptoms. Hormone tests. How is this treated? In some cases, no treatment is needed. Talk with your health care provider about if you should get treated. Treatments may include: Menopausal hormone therapy (MHT). Medicines to treat certain symptoms. Acupuncture. Vitamin or herbal  supplements. Before you start treatment, let your provider know if you or anyone in your family has or has had: Heart disease. Breast cancer. Blood clots. Diabetes. Osteoporosis. Follow these instructions at home: Eating and drinking  Eat a balanced diet. It should include: Fresh  fruits and vegetables. Whole grains. Lean protein. Low-fat dairy. Eat lots of foods that have calcium and vitamin D  in them. These can help keep your bones healthy. Foods and drinks that are rich in calcium include: Yogurt and low-fat milk. Beans. Almonds. Sardines. Broccoli and kale. To help prevent hot flashes, stay away from: Alcohol. Drinks with caffeine in them. Spicy foods. Lifestyle Do not smoke, vape, or use nicotine or tobacco. Get 7-8 hours of sleep each night. If you have hot flashes, you may want to: Dress in layers. Avoid things that may trigger hot flashes, like warm places or stress. Take slow, deep breaths when a hot flash starts. Keep a fan in your home and office. Find ways to manage stress. You may want to try: Deep breathing. Meditation. Writing in a journal. Ask your provider about going to group therapy. Therapy can help you get support from others who are going through menopause. General instructions  Talk with your provider before you take any herbal supplements. Keep track of your symptoms. Track: When they start. How often you have them. How long they last. Use vaginal lubricants or moisturizers. These can help with: Vaginal dryness. Comfort during sex. Contact a health care provider if: You're older than 55 and still get periods. You have pain during sex. You haven't had a period for 12 months and then start to bleed from your vagina. It hurts to pee. You get very bad headaches. Get help right away if: You're very depressed. You have a lot of bleeding from your vagina. Your heart is beating too fast. You have very bad belly pain or indigestion that doesn't go away with medicines. This information is not intended to replace advice given to you by your health care provider. Make sure you discuss any questions you have with your health care provider. Document Revised: 09/30/2022 Document Reviewed: 09/30/2022 Elsevier Patient Education  2024  [employer].

## 2023-08-01 ENCOUNTER — Ambulatory Visit: Admitting: Nurse Practitioner

## 2023-08-01 ENCOUNTER — Encounter: Payer: Self-pay | Admitting: Nurse Practitioner

## 2023-08-01 VITALS — BP 111/72 | HR 76 | Temp 98.7°F | Ht 67.0 in | Wt 203.8 lb

## 2023-08-01 DIAGNOSIS — E6609 Other obesity due to excess calories: Secondary | ICD-10-CM

## 2023-08-01 DIAGNOSIS — E66811 Obesity, class 1: Secondary | ICD-10-CM | POA: Diagnosis not present

## 2023-08-01 DIAGNOSIS — N951 Menopausal and female climacteric states: Secondary | ICD-10-CM | POA: Diagnosis not present

## 2023-08-01 DIAGNOSIS — Z6831 Body mass index (BMI) 31.0-31.9, adult: Secondary | ICD-10-CM | POA: Diagnosis not present

## 2023-08-01 NOTE — Progress Notes (Signed)
 BP 111/72   Pulse 76   Temp 98.7 F (37.1 C) (Oral)   Ht 5' 7 (1.702 m)   Wt 203 lb 12.8 oz (92.4 kg)   LMP 07/18/2023 (Approximate)   SpO2 99%   BMI 31.92 kg/m    Subjective:    Patient ID: Ruth Hanson, female    DOB: 1971/01/19, 53 y.o.   MRN: [id number]  HPI: Ruth Hanson is a 53 y.o. female  Chief Complaint  Patient presents with   Menopause    4 week f/up   MENOPAUSAL SYMPTOMS Follow-up today for menopausal symptoms.  We started 100 MG Gabapentin  on 06/20/23, as the 300 MG made her too tired and 600 MG gave her headaches.  Continues on [name] , cycles continue to be heavy and darker.  She feels like her symptoms are worsening.  Starts from pelvic area all the way up.  Her menstrual cycles are getting shorter, lighter/darker.   Gravida/Para: 2/2 Duration: stable Symptom severity: mild Hot flashes: yes Night sweats: yes Sleep disturbances: yes Vaginal dryness: no Dyspareunia:no Decreased libido: yes Emotional lability: yes Stress incontinence: no Previous HRT/pharmacotherapy: yes Hysterectomy: no Average interval between menses: 30 days Length of menses: 5 days Flow: heavy for 1st two days, then gets lighter Dysmenorrhea: yes GYN surgery: none Absolute Contraindications to Hormonal Therapy:     Undiagnosed vaginal bleeding: no    Breast cancer: no    Endometrial cancer: no    Coronary disease: no    Cerebrovascular disease: no    Venous thromboembolic disease: no   Relevant past medical, surgical, family and social history reviewed and updated as indicated. Interim medical history since our last visit reviewed. Allergies and medications reviewed and updated.  Review of Systems  Constitutional:  Negative for activity change, appetite change, diaphoresis, fatigue and fever.  Respiratory:  Negative for cough, chest tightness, shortness of breath and wheezing.   Cardiovascular:  Negative for chest pain, palpitations and leg swelling.   Gastrointestinal: Negative.   Endocrine: Negative for polydipsia, polyphagia and polyuria.  Neurological: Negative.   Psychiatric/Behavioral: Negative.      Per HPI unless specifically indicated above     Objective:    BP 111/72   Pulse 76   Temp 98.7 F (37.1 C) (Oral)   Ht 5' 7 (1.702 m)   Wt 203 lb 12.8 oz (92.4 kg)   LMP 07/18/2023 (Approximate)   SpO2 99%   BMI 31.92 kg/m   Wt Readings from Last 3 Encounters:  08/01/23 203 lb 12.8 oz (92.4 kg)  06/20/23 210 lb (95.3 kg)  05/19/23 198 lb (89.8 kg)    Physical Exam Vitals and nursing note reviewed.  Constitutional:      General: She is awake. She is not in acute distress.    Appearance: She is well-developed and well-groomed. She is obese. She is not ill-appearing or toxic-appearing.  HENT:     Head: Normocephalic.     Right Ear: Hearing and external ear normal.     Left Ear: Hearing and external ear normal.   Eyes:     General: Lids are normal.        Right eye: No discharge.        Left eye: No discharge.     Conjunctiva/sclera: Conjunctivae normal.     Pupils: Pupils are equal, round, and reactive to light.   Neck:     Thyroid: No thyromegaly.     Vascular: No carotid bruit.   Cardiovascular:  Rate and Rhythm: Normal rate and regular rhythm.     Heart sounds: Normal heart sounds. No murmur heard.    No gallop.  Pulmonary:     Effort: Pulmonary effort is normal. No accessory muscle usage or respiratory distress.     Breath sounds: Normal breath sounds.  Abdominal:     General: Bowel sounds are normal. There is no distension.     Palpations: Abdomen is soft.     Tenderness: There is no abdominal tenderness.   Musculoskeletal:     Cervical back: Normal range of motion and neck supple.     Right lower leg: No edema.     Left lower leg: No edema.  Lymphadenopathy:     Cervical: No cervical adenopathy.   Skin:    General: Skin is warm and dry.   Neurological:     Mental Status: She is alert  and oriented to person, place, and time.     Deep Tendon Reflexes: Reflexes are normal and symmetric.     Reflex Scores:      Brachioradialis reflexes are 2+ on the right side and 2+ on the left side.      Patellar reflexes are 2+ on the right side and 2+ on the left side.  Psychiatric:        Attention and Perception: Attention normal.        Mood and Affect: Mood normal.        Speech: Speech normal.        Behavior: Behavior normal. Behavior is cooperative.        Thought Content: Thought content normal.    Results for orders placed or performed in visit on 05/19/23  Microscopic Examination   Collection Time: 05/19/23  9:01 AM   Urine  Result Value Ref Range   WBC, UA 0-5 0 - 5 /hpf   RBC, Urine 0-2 0 - 2 /hpf   Epithelial Cells (non renal) 0-10 0 - 10 /hpf   Bacteria, UA Few None seen/Few  Urinalysis, Routine w reflex microscopic   Collection Time: 05/19/23  9:01 AM  Result Value Ref Range   Specific Gravity, UA 1.015 1.005 - 1.030   pH, UA 6.5 5.0 - 7.5   Color, UA Yellow Yellow   Appearance Ur Clear Clear   Leukocytes,UA Negative Negative   Protein,UA Negative Negative/Trace   Glucose, UA Negative Negative   Ketones, UA Negative Negative   RBC, UA Trace (A) Negative   Bilirubin, UA Negative Negative   Urobilinogen, Ur 0.2 0.2 - 1.0 mg/dL   Nitrite, UA Negative Negative   Microscopic Examination See below:       Assessment & Plan:   Problem List Items Addressed This Visit       Other   Obesity - Primary   BMI 31.92, 92 lbs loss total (started Wegovy  03/11/21), is tolerating. Will continue Wegovy , 2.4 MG weekly, discussed with patient at length -- will perform PA if needed as this regimen is offering benefit and she is almost at goal.  Recommended eating smaller high protein, low fat meals more frequently and exercising 30 mins a day 5 times a week with a goal of 10-15lb weight loss in the next 3 months. Patient voiced their understanding and motivation to adhere to  these recommendations.        Menopausal symptoms   Chronic, ongoing -- continue BCP which offers some benefit.  Still having cycles, although these are changing and becoming less in length.  The 300 worked well, but made groggy.  Will continue 100 MG, lowest dose, at night to offer some benefit.  If ongoing side effects then could trial Clonidine or Lyrica. Could consider Veozah, however would need to monitor LFTs. Would avoid SSRI or SNRI due to weight gain.        Follow up plan: Return in about 3 months (around 10/23/2023) for HTN/HLD, WEIGHT.

## 2023-08-01 NOTE — Assessment & Plan Note (Signed)
 Chronic, ongoing -- continue BCP which offers some benefit.  Still having cycles, although these are changing and becoming less in length.  The 300 worked well, but made groggy.  Will continue 100 MG, lowest dose, at night to offer some benefit.  If ongoing side effects then could trial Clonidine or Lyrica. Could consider Veozah, however would need to monitor LFTs. Would avoid SSRI or SNRI due to weight gain.

## 2023-08-01 NOTE — Assessment & Plan Note (Signed)
 BMI 31.92, 92 lbs loss total (started Wegovy  03/11/21), is tolerating. Will continue Wegovy , 2.4 MG weekly, discussed with patient at length -- will perform PA if needed as this regimen is offering benefit and she is almost at goal.  Recommended eating smaller high protein, low fat meals more frequently and exercising 30 mins a day 5 times a week with a goal of 10-15lb weight loss in the next 3 months. Patient voiced their understanding and motivation to adhere to these recommendations.

## 2023-08-23 DIAGNOSIS — M1711 Unilateral primary osteoarthritis, right knee: Secondary | ICD-10-CM | POA: Diagnosis not present

## 2023-08-23 DIAGNOSIS — M5136 Other intervertebral disc degeneration, lumbar region with discogenic back pain only: Secondary | ICD-10-CM | POA: Diagnosis not present

## 2023-08-23 DIAGNOSIS — R768 Other specified abnormal immunological findings in serum: Secondary | ICD-10-CM | POA: Diagnosis not present

## 2023-08-23 DIAGNOSIS — Z791 Long term (current) use of non-steroidal anti-inflammatories (NSAID): Secondary | ICD-10-CM | POA: Diagnosis not present

## 2023-09-06 ENCOUNTER — Other Ambulatory Visit (HOSPITAL_COMMUNITY): Payer: Self-pay

## 2023-09-06 ENCOUNTER — Telehealth: Payer: Self-pay

## 2023-09-06 NOTE — Telephone Encounter (Signed)
 Pharmacy Patient Advocate Encounter   Received notification from CoverMyMeds that prior authorization for Wegovy  2.4MG /0.75ML auto-injectors is required/requested.   Insurance verification completed.   The patient is insured through [facility] .   Per test claim: PA required; PA submitted to above mentioned insurance via CoverMyMeds Key/confirmation #/EOC [facility] Status is pending

## 2023-09-07 ENCOUNTER — Other Ambulatory Visit: Payer: Self-pay | Admitting: Nurse Practitioner

## 2023-09-08 ENCOUNTER — Other Ambulatory Visit (HOSPITAL_COMMUNITY): Payer: Self-pay

## 2023-09-08 NOTE — Telephone Encounter (Signed)
 Requested medications are due for refill today.  yes  Requested medications are on the active medications list.  yes  Last refill. 07/07/2023 #45 0 rf  Future visit scheduled.   yes  Notes to clinic.  Refill not delegated.    Requested Prescriptions  Pending Prescriptions Disp Refills   cyclobenzaprine  (FLEXERIL ) 10 MG tablet [Pharmacy Med Name: CYCLOBENZAPRINE  10 MG TABLET] 45 tablet 0    Sig: TAKE 1 TABLET BY MOUTH THREE TIMES A DAY AS NEEDED FOR MUSCLE SPASM     Not Delegated - Analgesics:  Muscle Relaxants Failed - 09/08/2023 12:29 PM      Failed - This refill cannot be delegated      Passed - Valid encounter within last 6 months    Recent Outpatient Visits           1 month ago Class 1 obesity due to excess calories with serious comorbidity and body mass index (BMI) of 31.0 to 31.9 in adult   [place] [facility] [place], [place] T, NP   2 months ago Class 1 obesity due to excess calories with serious comorbidity and body mass index (BMI) of 32.0 to 32.9 in adult   [place] [facility] [place], [place] T, NP   3 months ago Acute bilateral low back pain without sciatica   [place] [facility] [place], Melanie T, NP   5 months ago Essential hypertension   [place] [facility] [place], Melanie DASEN, NP

## 2023-09-08 NOTE — Telephone Encounter (Signed)
 Pharmacy Patient Advocate Encounter  Received notification from [facility] that Prior Authorization for Wegovy  2.4MG /0.75ML auto-injectors  has been APPROVED from 09/06/23 to 09/05/24. Ran test claim, Copay is $24.99. This test claim was processed through [facility]- copay amounts may vary at other pharmacies due to pharmacy/plan contracts, or as the patient moves through the different stages of their insurance plan.   PA #/Case ID/Reference #: [id number]

## 2023-09-15 ENCOUNTER — Other Ambulatory Visit: Payer: Self-pay | Admitting: Nurse Practitioner

## 2023-09-16 NOTE — Telephone Encounter (Signed)
 Requested Prescriptions  Pending Prescriptions Disp Refills   omeprazole  (PRILOSEC) 20 MG capsule [Pharmacy Med Name: OMEPRAZOLE  DR 20 MG CAPSULE] 90 capsule 0    Sig: TAKE 1 CAPSULE BY MOUTH EVERY DAY     Gastroenterology: Proton Pump Inhibitors Passed - 09/16/2023 11:04 PM      Passed - Valid encounter within last 12 months    Recent Outpatient Visits           1 month ago Class 1 obesity due to excess calories with serious comorbidity and body mass index (BMI) of 31.0 to 31.9 in adult   [place] [facility] [place], [place] T, NP   2 months ago Class 1 obesity due to excess calories with serious comorbidity and body mass index (BMI) of 32.0 to 32.9 in adult   [place] [facility] [place], [place] T, NP   4 months ago Acute bilateral low back pain without sciatica   [place] [facility] [place], Melanie T, NP   5 months ago Essential hypertension   [place] [facility] [place], Melanie DASEN, NP

## 2023-10-01 ENCOUNTER — Other Ambulatory Visit: Payer: Self-pay | Admitting: Nurse Practitioner

## 2023-10-03 NOTE — Telephone Encounter (Signed)
 Requested Prescriptions  Pending Prescriptions Disp Refills   potassium chloride  SA (KLOR-CON  M) 20 MEQ tablet [Pharmacy Med Name: POTASSIUM CL ER 20 MEQ TAB MCR] 90 tablet 1    Sig: TAKE 1 TABLET BY MOUTH EVERY DAY     Endocrinology:  Minerals - Potassium Supplementation Passed - 10/03/2023  2:48 PM      Passed - K in normal range and within 360 days    Potassium  Date Value Ref Range Status  05/19/2023 3.8 3.5 - 5.2 mmol/L Final         Passed - Cr in normal range and within 360 days    Creatinine, Ser  Date Value Ref Range Status  03/21/2023 0.84 0.57 - 1.00 mg/dL Final         Passed - Valid encounter within last 12 months    Recent Outpatient Visits           2 months ago Class 1 obesity due to excess calories with serious comorbidity and body mass index (BMI) of 31.0 to 31.9 in adult   [place] [facility] [place], [place] T, NP   3 months ago Class 1 obesity due to excess calories with serious comorbidity and body mass index (BMI) of 32.0 to 32.9 in adult   [place] [facility] [place], [place] T, NP   4 months ago Acute bilateral low back pain without sciatica   [place] [facility] [place], Melanie T, NP   6 months ago Essential hypertension   [place] [facility] [place], Melanie DASEN, NP

## 2023-10-26 ENCOUNTER — Other Ambulatory Visit: Payer: Self-pay | Admitting: Family Medicine

## 2023-10-27 NOTE — Telephone Encounter (Signed)
 Requested medications are due for refill today.  yes  Requested medications are on the active medications list.  yes  Last refill. 09/08/2023 #45 0 rf  Future visit scheduled.   yes  Notes to clinic.  Not delegated.   Requested Prescriptions  Pending Prescriptions Disp Refills   cyclobenzaprine  (FLEXERIL ) 10 MG tablet [Pharmacy Med Name: CYCLOBENZAPRINE  10 MG TABLET] 45 tablet 0    Sig: TAKE 1 TABLET BY MOUTH THREE TIMES A DAY AS NEEDED FOR MUSCLE SPASM     Not Delegated - Analgesics:  Muscle Relaxants Failed - 10/27/2023  2:15 PM      Failed - This refill cannot be delegated      Passed - Valid encounter within last 6 months    Recent Outpatient Visits           2 months ago Class 1 obesity due to excess calories with serious comorbidity and body mass index (BMI) of 31.0 to 31.9 in adult   [place] [facility] [place], [place] T, NP   4 months ago Class 1 obesity due to excess calories with serious comorbidity and body mass index (BMI) of 32.0 to 32.9 in adult   [place] [facility] [place], [place] T, NP   5 months ago Acute bilateral low back pain without sciatica   [place] [facility] [place], Melanie T, NP   7 months ago Essential hypertension   [place] [facility] [place], Melanie DASEN, NP

## 2023-10-30 NOTE — Patient Instructions (Signed)
 Be Involved in Caring For Your Health:  Taking Medications When medications are taken as directed, they can greatly improve your health. But if they are not taken as prescribed, they may not work. In some cases, not taking them correctly can be harmful. To help ensure your treatment remains effective and safe, understand your medications and how to take them. Bring your medications to each visit for review by your provider.  Your lab results, notes, and after visit summary will be available on My Chart. We strongly encourage you to use this feature. If lab results are abnormal the clinic will contact you with the appropriate steps. If the clinic does not contact you assume the results are satisfactory. You can always view your results on My Chart. If you have questions regarding your health or results, please contact the clinic during office hours. You can also ask questions on My Chart.  We at [facility] are grateful that you chose us  to provide your care. We strive to provide evidence-based and compassionate care and are always looking for feedback. If you get a survey from the clinic please complete this so we can hear your opinions.   Hypokalemia Hypokalemia means that the amount of potassium in the blood is lower than normal. Potassium is a mineral (electrolyte) that helps regulate the amount of fluid in the body. It also stimulates muscle tightening (contraction) and helps nerves work properly. Normally, most of the body's potassium is inside cells, and only a very small amount is in the blood. Because the amount in the blood is so small, minor changes to potassium levels in the blood can be life-threatening. What are the causes? This condition may be caused by: Antibiotic medicine. Diarrhea or vomiting. Taking too much of a medicine that helps you have a bowel movement (laxative) can cause diarrhea and lead to hypokalemia. Chronic kidney disease (CKD). Medicines that help the  body get rid of excess fluid (diuretics). Eating disorders, such as anorexia or bulimia. Low magnesium levels in the body. Sweating a lot. What are the signs or symptoms? Symptoms of this condition include: Weakness. Constipation. Fatigue. Muscle cramps. Mental confusion. Skipped heartbeats or irregular heartbeat (palpitations). Tingling or numbness. How is this diagnosed? This condition is diagnosed with a blood test. How is this treated? This condition may be treated by: Taking potassium supplements. Adjusting the medicines that you take. Eating more foods that contain a lot of potassium. If your potassium level is very low, you may need to get potassium through an IV and be monitored in the hospital. Follow these instructions at home: Eating and drinking  Eat a healthy diet. A healthy diet includes fresh fruits and vegetables, whole grains, healthy fats, and lean proteins. If told, eat more foods that contain a lot of potassium. These include: Nuts, such as peanuts and pistachios. Seeds, such as sunflower seeds and pumpkin seeds. Peas, lentils, and lima beans. Whole grain and bran cereals and breads. Fresh fruits and vegetables, such as apricots, avocado, bananas, cantaloupe, kiwi, oranges, tomatoes, asparagus, and potatoes. Juices, such as orange, tomato, and prune. Lean meats, including fish. Milk and milk products, such as yogurt. General instructions Take over-the-counter and prescription medicines only as told by your health care provider. This includes vitamins, natural food products, and supplements. Keep all follow-up visits. This is important. Contact a health care provider if: You have weakness that gets worse. You feel your heart pounding or racing. You vomit. You have diarrhea. You have diabetes and you  have trouble keeping your blood sugar in your target range. Get help right away if: You have chest pain. You have shortness of breath. You have vomiting or  diarrhea that lasts for more than 2 days. You faint. These symptoms may be an emergency. Get help right away. Call 911. Do not wait to see if the symptoms will go away. Do not drive yourself to the hospital. Summary Hypokalemia means that the amount of potassium in the blood is lower than normal. This condition is diagnosed with a blood test. Hypokalemia may be treated by taking potassium supplements, adjusting the medicines that you take, or eating more foods that are high in potassium. If your potassium level is very low, you may need to get potassium through an IV and be monitored in the hospital. This information is not intended to replace advice given to you by your health care provider. Make sure you discuss any questions you have with your health care provider. Document Revised: 10/09/2020 Document Reviewed: 10/09/2020 Elsevier Patient Education  2024 [employer].

## 2023-11-01 ENCOUNTER — Ambulatory Visit: Admitting: Nurse Practitioner

## 2023-11-01 ENCOUNTER — Encounter: Payer: Self-pay | Admitting: Nurse Practitioner

## 2023-11-01 VITALS — BP 112/74 | HR 71 | Temp 97.9°F | Resp 17 | Ht 67.01 in | Wt 204.4 lb

## 2023-11-01 DIAGNOSIS — Z6831 Body mass index (BMI) 31.0-31.9, adult: Secondary | ICD-10-CM

## 2023-11-01 DIAGNOSIS — I10 Essential (primary) hypertension: Secondary | ICD-10-CM

## 2023-11-01 DIAGNOSIS — Z23 Encounter for immunization: Secondary | ICD-10-CM | POA: Diagnosis not present

## 2023-11-01 DIAGNOSIS — E6609 Other obesity due to excess calories: Secondary | ICD-10-CM

## 2023-11-01 DIAGNOSIS — E876 Hypokalemia: Secondary | ICD-10-CM | POA: Diagnosis not present

## 2023-11-01 DIAGNOSIS — E66811 Obesity, class 1: Secondary | ICD-10-CM

## 2023-11-01 NOTE — Assessment & Plan Note (Signed)
 Chronic, stable.  BP at goal today on exam. Recommend she monitor BP at least a few mornings a week at home and document.  DASH diet at home.  Continue current medication regimen and adjust as needed.  Labs: BMP.  Refills sent.

## 2023-11-01 NOTE — Assessment & Plan Note (Signed)
 BMI 32.01, 92 lbs loss total (started Wegovy  03/11/21), is tolerating. Will continue Wegovy , 2.4 MG weekly, discussed with patient at length -- will perform PA if needed as this regimen is offering benefit and she is almost at goal.  Recommended eating smaller high protein, low fat meals more frequently and exercising 30 mins a day 5 times a week with a goal of 10-15lb weight loss in the next 3 months. Patient voiced their understanding and motivation to adhere to these recommendations.

## 2023-11-01 NOTE — Progress Notes (Signed)
 BP 112/74 (BP Location: Left Arm, Patient Position: Sitting, Cuff Size: Large)   Pulse 71   Temp 97.9 F (36.6 C) (Oral)   Resp 17   Ht 5' 7.01 (1.702 m)   Wt 204 lb 6.4 oz (92.7 kg)   LMP  (LMP Unknown)   SpO2 99%   BMI 32.01 kg/m    Subjective:    Patient ID: Ruth Hanson, female    DOB: 1970-05-23, 53 y.o.   MRN: [id number]  HPI: Ruth Hanson is a 53 y.o. female  Chief Complaint  Patient presents with   HTN/HLD    No home checks but is aware she should    Weight Check    Going down to 198 to 200. Feels like her body is used to Wegovy .    HYPERTENSION / HYPERLIPIDEMIA Taking HCTZ and K+ daily.  Satisfied with current treatment? yes Duration of hypertension: chronic BP monitoring frequency: not checking BP range:  BP medication side effects: no Aspirin: no Recent stressors: no Recurrent headaches: no Visual changes: no Palpitations: no Dyspnea: no Chest pain: no Lower extremity edema: occasional swelling to ankles Dizzy/lightheaded: no  The 10-year ASCVD risk score (Arnett DK, et al., 2019) is: 1.6%   Values used to calculate the score:     Age: 58 years     Clincally relevant sex: Female     Is Non-Hispanic African American: Yes     Diabetic: No     Tobacco smoker: No     Systolic Blood Pressure: 112 mmHg     Is BP treated: Yes     HDL Cholesterol: 88 mg/dL     Total Cholesterol: 185 mg/dL   WEIGHT GAIN Taking Wegovy  2.4 MG weekly. Started on 03/11/2021 when she was 291 lbs. Her goal is to get to 190 - 195 lbs.  Had lost 92 lbs, has lost 6 lbs since May 2025. Continues to work on diet and working out 2 times a week (she has started second job, so unable to fit in more time for fitness). Works 7 am to 4 pm, then 4:30 pm to 10:30 pm.   Duration: chronic Previous attempts at weight loss: yes Complications of obesity: HTN/HLD Peak weight: 291 lbs Weight loss goal: 190 to 195 lbs Weight loss to date: 80 lbs, had been 92 lbs Requesting obesity  pharmacotherapy: yes Current weight loss supplements/medications: yes Previous weight loss supplements/meds: yes Calories:  2000  Relevant past medical, surgical, family and social history reviewed and updated as indicated. Interim medical history since our last visit reviewed. Allergies and medications reviewed and updated.  Review of Systems  Constitutional:  Negative for activity change, appetite change, diaphoresis, fatigue and fever.  HENT: Negative.    Respiratory:  Negative for cough, chest tightness, shortness of breath and wheezing.   Cardiovascular:  Negative for chest pain, palpitations and leg swelling.  Gastrointestinal: Negative.   Neurological: Negative.   Psychiatric/Behavioral: Negative.      Per HPI unless specifically indicated above     Objective:     BP 112/74 (BP Location: Left Arm, Patient Position: Sitting, Cuff Size: Large)   Pulse 71   Temp 97.9 F (36.6 C) (Oral)   Resp 17   Ht 5' 7.01 (1.702 m)   Wt 204 lb 6.4 oz (92.7 kg)   LMP  (LMP Unknown)   SpO2 99%   BMI 32.01 kg/m   Wt Readings from Last 3 Encounters:  11/01/23 204 lb 6.4 oz (  92.7 kg)  08/01/23 203 lb 12.8 oz (92.4 kg)  06/20/23 210 lb (95.3 kg)    Physical Exam Vitals and nursing note reviewed.  Constitutional:      General: She is awake. She is not in acute distress.    Appearance: She is well-developed and well-groomed. She is obese. She is not ill-appearing or toxic-appearing.  HENT:     Head: Normocephalic.     Right Ear: Hearing normal.     Left Ear: Hearing normal.  Eyes:     General: Lids are normal.        Right eye: No discharge.        Left eye: No discharge.     Conjunctiva/sclera: Conjunctivae normal.     Pupils: Pupils are equal, round, and reactive to light.  Neck:     Thyroid: No thyromegaly.     Vascular: No carotid bruit.  Cardiovascular:     Rate and Rhythm: Normal rate and regular rhythm.     Heart sounds: Normal heart sounds. No murmur heard.    No  gallop.  Pulmonary:     Effort: Pulmonary effort is normal. No accessory muscle usage or respiratory distress.     Breath sounds: Normal breath sounds.  Abdominal:     General: Bowel sounds are normal. There is no distension.     Palpations: Abdomen is soft.     Tenderness: There is no abdominal tenderness.     Hernia: No hernia is present.  Musculoskeletal:     Cervical back: Normal range of motion and neck supple.     Lumbar back: Normal.     Right lower leg: No edema.     Left lower leg: No edema.  Lymphadenopathy:     Cervical: No cervical adenopathy.  Skin:    General: Skin is warm and dry.  Neurological:     Mental Status: She is alert and oriented to person, place, and time.  Psychiatric:        Attention and Perception: Attention normal.        Mood and Affect: Mood normal.        Behavior: Behavior normal. Behavior is cooperative.        Thought Content: Thought content normal.        Judgment: Judgment normal.    Results for orders placed or performed in visit on 05/19/23  Microscopic Examination   Collection Time: 05/19/23  9:01 AM   Urine  Result Value Ref Range   WBC, UA 0-5 0 - 5 /hpf   RBC, Urine 0-2 0 - 2 /hpf   Epithelial Cells (non renal) 0-10 0 - 10 /hpf   Bacteria, UA Few None seen/Few  Urinalysis, Routine w reflex microscopic   Collection Time: 05/19/23  9:01 AM  Result Value Ref Range   Specific Gravity, UA 1.015 1.005 - 1.030   pH, UA 6.5 5.0 - 7.5   Color, UA Yellow Yellow   Appearance Ur Clear Clear   Leukocytes,UA Negative Negative   Protein,UA Negative Negative/Trace   Glucose, UA Negative Negative   Ketones, UA Negative Negative   RBC, UA Trace (A) Negative   Bilirubin, UA Negative Negative   Urobilinogen, Ur 0.2 0.2 - 1.0 mg/dL   Nitrite, UA Negative Negative   Microscopic Examination See below:       Assessment & Plan:   Problem List Items Addressed This Visit       Cardiovascular and Mediastinum   Essential hypertension -  Primary  Chronic, stable.  BP at goal today on exam. Recommend she monitor BP at least a few mornings a week at home and document.  DASH diet at home.  Continue current medication regimen and adjust as needed.  Labs: BMP.  Refills sent.       Relevant Orders   Basic metabolic panel with GFR     Other   Obesity   BMI 32.01, 92 lbs loss total (started Wegovy  03/11/21), is tolerating. Will continue Wegovy , 2.4 MG weekly, discussed with patient at length -- will perform PA if needed as this regimen is offering benefit and she is almost at goal.  Recommended eating smaller high protein, low fat meals more frequently and exercising 30 mins a day 5 times a week with a goal of 10-15lb weight loss in the next 3 months. Patient voiced their understanding and motivation to adhere to these recommendations.        Other Visit Diagnoses       Hypokalemia       On supplement, recheck today.   Relevant Orders   Basic metabolic panel with GFR     Pneumococcal vaccination given       PCV20 in office today, educated patient.   Relevant Orders   Pneumococcal conjugate vaccine 20-valent        Follow up plan: Return in about 5 months (around 03/21/2024) for Annual Physical -- after 03/20/24.

## 2023-11-02 ENCOUNTER — Ambulatory Visit: Payer: Self-pay | Admitting: Nurse Practitioner

## 2023-11-02 LAB — BASIC METABOLIC PANEL WITH GFR
BUN/Creatinine Ratio: 10 (ref 9–23)
BUN: 8 mg/dL (ref 6–24)
CO2: 23 mmol/L (ref 20–29)
Calcium: 9.2 mg/dL (ref 8.7–10.2)
Chloride: 97 mmol/L (ref 96–106)
Creatinine, Ser: 0.82 mg/dL (ref 0.57–1.00)
Glucose: 82 mg/dL (ref 70–99)
Potassium: 3.4 mmol/L — ABNORMAL LOW (ref 3.5–5.2)
Sodium: 135 mmol/L (ref 134–144)
eGFR: 85 mL/min/1.73 (ref >59)

## 2023-11-02 MED ORDER — POTASSIUM CHLORIDE CRYS ER 20 MEQ PO TBCR: 20.0000 meq | EXTENDED_RELEASE_TABLET | Freq: Every day | ORAL | 1 refills | Status: AC

## 2023-11-02 NOTE — Progress Notes (Signed)
 Contacted via MyChart  Good afternoon La Vita, your labs have returned. Potassium level is very mildly low. Are you taking potassium supplement daily? I sent in refills.  We may need to reduce your Hydrochlorothiazide  in future as it can lower potassium levels.  Any questions? Keep being stellar!!  Thank you for allowing me to participate in your care.  I appreciate you. Kindest regards, Zakkiyya Barno

## 2023-12-18 ENCOUNTER — Other Ambulatory Visit: Payer: Self-pay | Admitting: Nurse Practitioner

## 2023-12-20 NOTE — Telephone Encounter (Signed)
 Requested medications are due for refill today.  yes  Requested medications are on the active medications list.  yes  Last refill. 10/28/2023 #45 0 rf  Future visit scheduled.   yes  Notes to clinic.  Refill not delegated    Requested Prescriptions  Pending Prescriptions Disp Refills   cyclobenzaprine  (FLEXERIL ) 10 MG tablet [Pharmacy Med Name: CYCLOBENZAPRINE  10 MG TABLET] 45 tablet 0    Sig: TAKE 1 TABLET BY MOUTH THREE TIMES A DAY AS NEEDED FOR MUSCLE SPASM     Not Delegated - Analgesics:  Muscle Relaxants Failed - 12/20/2023 10:05 AM      Failed - This refill cannot be delegated      Passed - Valid encounter within last 6 months    Recent Outpatient Visits           1 month ago Essential hypertension   [place] [place], [place] T, NP   4 months ago Class 1 obesity due to excess calories with serious comorbidity and body mass index (BMI) of 31.0 to 31.9 in adult   [facility] [place], [place] T, NP   6 months ago Class 1 obesity due to excess calories with serious comorbidity and body mass index (BMI) of 32.0 to 32.9 in adult   [facility] [place], Melanie T, NP   7 months ago Acute bilateral low back pain without sciatica   [place] [facility] [place], [place] T, NP   9 months ago Essential hypertension   [place] [facility] [place], [place] T, NP              Signed Prescriptions Disp Refills   omeprazole  (PRILOSEC) 20 MG capsule 90 capsule 1    Sig: TAKE 1 CAPSULE BY MOUTH EVERY DAY     Gastroenterology: Proton Pump Inhibitors Passed - 12/20/2023 10:05 AM      Passed - Valid encounter within last 12 months    Recent Outpatient Visits           1 month ago Essential hypertension   [place] [facility] [place], [place] T, NP   4 months ago Class 1 obesity due to excess calories with serious comorbidity and body mass index (BMI) of 31.0 to  31.9 in adult   [facility] [place], [place] T, NP   6 months ago Class 1 obesity due to excess calories with serious comorbidity and body mass index (BMI) of 32.0 to 32.9 in adult   [place] [facility] [place], [place] T, NP   7 months ago Acute bilateral low back pain without sciatica   [place] [facility] [place], Melanie DASEN, NP   9 months ago Essential hypertension   [place] [facility] [place], Melanie DASEN, NP

## 2023-12-20 NOTE — Telephone Encounter (Signed)
 Requested Prescriptions  Pending Prescriptions Disp Refills   omeprazole  (PRILOSEC) 20 MG capsule [Pharmacy Med Name: OMEPRAZOLE  DR 20 MG CAPSULE] 90 capsule 1    Sig: TAKE 1 CAPSULE BY MOUTH EVERY DAY     Gastroenterology: Proton Pump Inhibitors Passed - 12/20/2023 10:04 AM      Passed - Valid encounter within last 12 months    Recent Outpatient Visits           1 month ago Essential hypertension   [facility] [place], [place] T, NP   4 months ago Class 1 obesity due to excess calories with serious comorbidity and body mass index (BMI) of 31.0 to 31.9 in adult   [place] [facility] [place], [place] T, NP   6 months ago Class 1 obesity due to excess calories with serious comorbidity and body mass index (BMI) of 32.0 to 32.9 in adult   [place] [facility] [place], Melanie T, NP   7 months ago Acute bilateral low back pain without sciatica   [place] [facility] [place], [place] T, NP   9 months ago Essential hypertension   [place] [place], [place] T, NP               cyclobenzaprine  (FLEXERIL ) 10 MG tablet [Pharmacy Med Name: CYCLOBENZAPRINE  10 MG TABLET] 45 tablet 0    Sig: TAKE 1 TABLET BY MOUTH THREE TIMES A DAY AS NEEDED FOR MUSCLE SPASM     Not Delegated - Analgesics:  Muscle Relaxants Failed - 12/20/2023 10:04 AM      Failed - This refill cannot be delegated      Passed - Valid encounter within last 6 months    Recent Outpatient Visits           1 month ago Essential hypertension   [place] [facility] [place], [place] T, NP   4 months ago Class 1 obesity due to excess calories with serious comorbidity and body mass index (BMI) of 31.0 to 31.9 in adult   [facility] [place], [place] T, NP   6 months ago Class 1 obesity due to excess calories with serious comorbidity and body mass index (BMI) of 32.0 to 32.9 in adult   [place]  [facility] [place], [place] T, NP   7 months ago Acute bilateral low back pain without sciatica   [place] [facility] [place], Melanie DASEN, NP   9 months ago Essential hypertension   [place] [facility] [place], Melanie DASEN, NP

## 2024-01-22 ENCOUNTER — Other Ambulatory Visit: Payer: Self-pay | Admitting: Nurse Practitioner

## 2024-01-24 NOTE — Telephone Encounter (Signed)
 Requested medication (s) are due for refill today: Yes  Requested medication (s) are on the active medication list: Yes  Last refill:  03/21/23  Future visit scheduled: Yes  Notes to clinic:  Unable to refill per protocol due to failed labs, no updated results for A1C.     Requested Prescriptions  Pending Prescriptions Disp Refills   WEGOVY  2.4 MG/0.75ML SOAJ SQ injection [Pharmacy Med Name: WEGOVY  2.4 MG/0.75 ML PEN]  5    Sig: Inject 2.4 mg into the skin once a week.     Endocrinology:  Diabetes - GLP-1 Receptor Agonists - semaglutide  Failed - 01/24/2024  5:22 PM      Failed - HBA1C in normal range and within 180 days    Hgb A1c MFr Bld  Date Value Ref Range Status  12/31/2020 5.6 4.8 - 5.6 % Final    Comment:             Prediabetes: 5.7 - 6.4          Diabetes: >6.4          Glycemic control for adults with diabetes: <7.0          Passed - Cr in normal range and within 360 days    Creatinine, Ser  Date Value Ref Range Status  11/01/2023 0.82 0.57 - 1.00 mg/dL Final         Passed - Valid encounter within last 6 months    Recent Outpatient Visits           2 months ago Essential hypertension   [facility] [place], [place] T, NP   5 months ago Class 1 obesity due to excess calories with serious comorbidity and body mass index (BMI) of 31.0 to 31.9 in adult   [place] [facility] [place], [place] T, NP   7 months ago Class 1 obesity due to excess calories with serious comorbidity and body mass index (BMI) of 32.0 to 32.9 in adult   [facility] [place], [place] T, NP   8 months ago Acute bilateral low back pain without sciatica   [place] [facility] [place], Melanie DASEN, NP   10 months ago Essential hypertension   [place] [facility] [place], Melanie DASEN, NP

## 2024-02-15 ENCOUNTER — Other Ambulatory Visit: Payer: Self-pay | Admitting: Nurse Practitioner

## 2024-02-15 DIAGNOSIS — Z1231 Encounter for screening mammogram for malignant neoplasm of breast: Secondary | ICD-10-CM

## 2024-02-22 ENCOUNTER — Telehealth: Payer: Self-pay

## 2024-02-22 NOTE — Telephone Encounter (Signed)
 Copied from CRM [id number]. Topic: Clinical - Prescription Issue >> Feb 22, 2024  4:29 PM Delon T wrote: Reason for CRM: semaglutide -weight management (WEGOVY ) 2.4 MG/0.75ML SOAJ SQ injection- not covered by insurance per pharmacy- [phone]

## 2024-02-23 NOTE — Telephone Encounter (Signed)
 Can a PA be checked for this patient. Thank you!

## 2024-02-26 ENCOUNTER — Other Ambulatory Visit: Payer: Self-pay | Admitting: Nurse Practitioner

## 2024-02-27 NOTE — Telephone Encounter (Signed)
 Requested medications are due for refill today.  no  Requested medications are on the active medications list.  yes  Last refill. 01/25/2024 3mL 5 rf  Future visit scheduled.   yes  Notes to clinic.  Pharmacy comment: Alternative Requested:PA REQUIRED.     Requested Prescriptions  Pending Prescriptions Disp Refills   WEGOVY  2.4 MG/0.75ML SOAJ SQ injection [Pharmacy Med Name: WEGOVY  2.4 MG/0.75 ML PEN]  5    Sig: INJECT 2.4 MG INTO THE SKIN ONCE A WEEK.     Endocrinology:  Diabetes - GLP-1 Receptor Agonists - semaglutide  Failed - 02/27/2024  4:48 PM      Failed - HBA1C in normal range and within 180 days    Hgb A1c MFr Bld  Date Value Ref Range Status  12/31/2020 5.6 4.8 - 5.6 % Final    Comment:             Prediabetes: 5.7 - 6.4          Diabetes: >6.4          Glycemic control for adults with diabetes: <7.0          Passed - Cr in normal range and within 360 days    Creatinine, Ser  Date Value Ref Range Status  11/01/2023 0.82 0.57 - 1.00 mg/dL Final         Passed - Valid encounter within last 6 months    Recent Outpatient Visits           3 months ago Essential hypertension   [facility] [place], Ida T, NP   7 months ago Class 1 obesity due to excess calories with serious comorbidity and body mass index (BMI) of 31.0 to 31.9 in adult   [facility] [place], [place] T, NP   8 months ago Class 1 obesity due to excess calories with serious comorbidity and body mass index (BMI) of 32.0 to 32.9 in adult   [place] [place], [place] T, NP   9 months ago Acute bilateral low back pain without sciatica   [place] [facility] [place], Melanie DASEN, NP   11 months ago Essential hypertension   [place] [facility] [place], Melanie DASEN, NP

## 2024-02-28 ENCOUNTER — Telehealth: Payer: Self-pay

## 2024-02-28 ENCOUNTER — Ambulatory Visit
Admission: RE | Admit: 2024-02-28 | Discharge: 2024-02-28 | Disposition: A | Discharge status: Home / Self Care | Source: Ambulatory Visit | Attending: Nurse Practitioner | Admitting: Nurse Practitioner

## 2024-02-28 ENCOUNTER — Other Ambulatory Visit (HOSPITAL_COMMUNITY): Payer: Self-pay

## 2024-02-28 DIAGNOSIS — Z1231 Encounter for screening mammogram for malignant neoplasm of breast: Secondary | ICD-10-CM | POA: Diagnosis present

## 2024-02-28 NOTE — Telephone Encounter (Signed)
 Pharmacy Patient Advocate Encounter   Received notification from RX Request Messages that prior authorization for Wegovy  2.4mg /0.15ml is required/requested.   Insurance verification completed.   The patient is insured through [facility].   Per test claim: PA required; PA submitted to above mentioned insurance via Latent Key/confirmation #/EOC [id number] Status is pending

## 2024-02-28 NOTE — Telephone Encounter (Signed)
 PA team- can you guys submit this PA please?

## 2024-03-02 ENCOUNTER — Other Ambulatory Visit: Payer: Self-pay | Admitting: Nurse Practitioner

## 2024-03-02 ENCOUNTER — Ambulatory Visit: Payer: Self-pay | Admitting: Nurse Practitioner

## 2024-03-02 DIAGNOSIS — R928 Other abnormal and inconclusive findings on diagnostic imaging of breast: Secondary | ICD-10-CM

## 2024-03-02 NOTE — Progress Notes (Signed)
 Contacted via MyChart  Good morning La Vita, your mammogram returned with some abnormal findings and they need to perform further imaging. Please call below to schedule: Please call to schedule your mammogram and/or bone density: [facility]  Address: [address], [place], [place] [address] Phone: [phone]  [facility] [address] [place],  [place]  [address] Phone: [phone]

## 2024-03-02 NOTE — Telephone Encounter (Signed)
 Pharmacy Patient Advocate Encounter  Received notification from [facility] that Prior Authorization for Wegovy  2.4mg /0.29ml has been DENIED.  See denial reason below. No denial letter attached in CMM. Will attach denial letter to Media tab once received.   PA #/Case ID/Reference #: [id number]

## 2024-03-02 NOTE — Telephone Encounter (Signed)
 Noted.

## 2024-03-06 NOTE — Telephone Encounter (Signed)
 Copied from CRM #[id number]. Topic: Clinical - Lab/Test Results >> Mar 05, 2024 12:20 PM Delon DASEN wrote: Reason for CRM: wants to go over mammogram results, has questions- please call [phone]

## 2024-03-07 NOTE — Telephone Encounter (Signed)
 Addressed in other encounter.

## 2024-03-12 ENCOUNTER — Encounter

## 2024-03-21 ENCOUNTER — Encounter

## 2024-04-05 ENCOUNTER — Encounter: Admitting: Nurse Practitioner
